# Patient Record
Sex: Male | Born: 1965 | Race: Black or African American | Hispanic: No | Smoking: Current every day smoker
Health system: Southern US, Community
[De-identification: ages and names within clinical notes are randomized; demographics above are authoritative.]

## PROBLEM LIST (undated history)

## (undated) DIAGNOSIS — K219 Gastro-esophageal reflux disease without esophagitis: Secondary | ICD-10-CM

## (undated) DIAGNOSIS — Z8719 Personal history of other diseases of the digestive system: Secondary | ICD-10-CM

## (undated) DIAGNOSIS — I10 Essential (primary) hypertension: Secondary | ICD-10-CM

## (undated) DIAGNOSIS — Z8711 Personal history of peptic ulcer disease: Secondary | ICD-10-CM

## (undated) DIAGNOSIS — M1990 Unspecified osteoarthritis, unspecified site: Secondary | ICD-10-CM

## (undated) DIAGNOSIS — K852 Alcohol induced acute pancreatitis without necrosis or infection: Secondary | ICD-10-CM

## (undated) DIAGNOSIS — E7800 Pure hypercholesterolemia, unspecified: Secondary | ICD-10-CM

## (undated) HISTORY — DX: Unspecified osteoarthritis, unspecified site: M19.90

---
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
## 2001-01-24 ENCOUNTER — Emergency Department (HOSPITAL_COMMUNITY): Admission: EM | Admit: 2001-01-24 | Discharge: 2001-01-24 | Payer: Self-pay | Admitting: Emergency Medicine

## 2001-04-03 ENCOUNTER — Emergency Department (HOSPITAL_COMMUNITY): Admission: EM | Admit: 2001-04-03 | Discharge: 2001-04-03 | Payer: Self-pay | Admitting: Emergency Medicine

## 2002-07-30 ENCOUNTER — Encounter: Payer: Self-pay | Admitting: Emergency Medicine

## 2002-07-30 ENCOUNTER — Emergency Department (HOSPITAL_COMMUNITY): Admission: EM | Admit: 2002-07-30 | Discharge: 2002-07-30 | Payer: Self-pay | Admitting: Emergency Medicine

## 2002-08-28 ENCOUNTER — Emergency Department (HOSPITAL_COMMUNITY): Admission: EM | Admit: 2002-08-28 | Discharge: 2002-08-28 | Payer: Self-pay | Admitting: Emergency Medicine

## 2002-08-28 ENCOUNTER — Encounter: Payer: Self-pay | Admitting: Emergency Medicine

## 2002-09-02 ENCOUNTER — Inpatient Hospital Stay (HOSPITAL_COMMUNITY): Admission: EM | Admit: 2002-09-02 | Discharge: 2002-09-04 | Payer: Self-pay | Admitting: Emergency Medicine

## 2002-09-25 ENCOUNTER — Inpatient Hospital Stay (HOSPITAL_COMMUNITY): Admission: EM | Admit: 2002-09-25 | Discharge: 2002-09-26 | Payer: Self-pay | Admitting: Emergency Medicine

## 2002-09-25 ENCOUNTER — Encounter: Payer: Self-pay | Admitting: Internal Medicine

## 2003-11-07 ENCOUNTER — Emergency Department (HOSPITAL_COMMUNITY): Admission: EM | Admit: 2003-11-07 | Discharge: 2003-11-07 | Payer: Self-pay | Admitting: Emergency Medicine

## 2003-11-08 ENCOUNTER — Emergency Department (HOSPITAL_COMMUNITY): Admission: EM | Admit: 2003-11-08 | Discharge: 2003-11-08 | Payer: Self-pay | Admitting: Emergency Medicine

## 2004-04-25 ENCOUNTER — Emergency Department (HOSPITAL_COMMUNITY): Admission: EM | Admit: 2004-04-25 | Discharge: 2004-04-25 | Payer: Self-pay | Admitting: Emergency Medicine

## 2004-04-26 ENCOUNTER — Emergency Department (HOSPITAL_COMMUNITY): Admission: EM | Admit: 2004-04-26 | Discharge: 2004-04-26 | Payer: Self-pay | Admitting: *Deleted

## 2004-07-01 ENCOUNTER — Emergency Department (HOSPITAL_COMMUNITY): Admission: EM | Admit: 2004-07-01 | Discharge: 2004-07-01 | Payer: Self-pay | Admitting: Emergency Medicine

## 2004-12-03 ENCOUNTER — Emergency Department (HOSPITAL_COMMUNITY): Admission: EM | Admit: 2004-12-03 | Discharge: 2004-12-03 | Payer: Self-pay | Admitting: Emergency Medicine

## 2005-01-26 ENCOUNTER — Emergency Department (HOSPITAL_COMMUNITY): Admission: EM | Admit: 2005-01-26 | Discharge: 2005-01-26 | Payer: Self-pay | Admitting: Emergency Medicine

## 2007-07-02 ENCOUNTER — Emergency Department (HOSPITAL_COMMUNITY): Admission: EM | Admit: 2007-07-02 | Discharge: 2007-07-02 | Payer: Self-pay | Admitting: *Deleted

## 2007-07-05 ENCOUNTER — Emergency Department (HOSPITAL_COMMUNITY): Admission: EM | Admit: 2007-07-05 | Discharge: 2007-07-05 | Payer: Self-pay | Admitting: Emergency Medicine

## 2007-07-07 ENCOUNTER — Emergency Department (HOSPITAL_COMMUNITY): Admission: EM | Admit: 2007-07-07 | Discharge: 2007-07-07 | Payer: Self-pay | Admitting: Emergency Medicine

## 2007-07-09 ENCOUNTER — Emergency Department (HOSPITAL_COMMUNITY): Admission: EM | Admit: 2007-07-09 | Discharge: 2007-07-09 | Payer: Self-pay | Admitting: Emergency Medicine

## 2007-07-11 ENCOUNTER — Emergency Department (HOSPITAL_COMMUNITY): Admission: EM | Admit: 2007-07-11 | Discharge: 2007-07-11 | Payer: Self-pay | Admitting: Emergency Medicine

## 2008-01-02 ENCOUNTER — Emergency Department (HOSPITAL_COMMUNITY): Admission: EM | Admit: 2008-01-02 | Discharge: 2008-01-02 | Payer: Self-pay | Admitting: Emergency Medicine

## 2009-01-18 ENCOUNTER — Emergency Department (HOSPITAL_COMMUNITY): Admission: EM | Admit: 2009-01-18 | Discharge: 2009-01-18 | Payer: Self-pay | Admitting: Emergency Medicine

## 2011-02-03 LAB — DIFFERENTIAL
Basophils Absolute: 0 10*3/uL (ref 0.0–0.1)
Basophils Relative: 0 % (ref 0–1)
Eosinophils Absolute: 0.1 10*3/uL (ref 0.0–0.7)
Eosinophils Relative: 1 % (ref 0–5)
Lymphocytes Relative: 27 % (ref 12–46)

## 2011-02-03 LAB — BASIC METABOLIC PANEL
BUN: 11 mg/dL (ref 6–23)
CO2: 28 mEq/L (ref 19–32)
Calcium: 9.2 mg/dL (ref 8.4–10.5)
GFR calc non Af Amer: >60 mL/min (ref >60)
Glucose, Bld: 93 mg/dL (ref 70–99)
Potassium: 3.4 mEq/L — ABNORMAL LOW (ref 3.5–5.1)

## 2011-02-03 LAB — CBC
HCT: 43.5 % (ref 39.0–52.0)
MCHC: 33.9 g/dL (ref 30.0–36.0)
Platelets: 303 10*3/uL (ref 150–400)
RDW: 14.8 % (ref 11.5–15.5)

## 2011-02-03 LAB — RAPID URINE DRUG SCREEN, HOSP PERFORMED
Barbiturates: NOT DETECTED
Benzodiazepines: NOT DETECTED

## 2011-02-03 LAB — LIPASE, BLOOD: Lipase: 33 U/L (ref 11–59)

## 2011-03-11 NOTE — H&P (Signed)
NAME:  Gary [name], Gary [name] NO.:  [id number]   MEDICAL RECORD NO.:  [id number]                   PATIENT TYPE:  INP   LOCATION:  A228                                 FACILITY:  [facility]   PHYSICIAN:  Gracelyn Nurse, M.D.              DATE OF BIRTH:  [birth year]   DATE OF ADMISSION:  09/25/2002  DATE OF DISCHARGE:                                HISTORY & PHYSICAL   CHIEF COMPLAINT:  Vomiting blood.   HISTORY OF PRESENT ILLNESS:  This is a 45 year old black male who was  discharged about four weeks ago from [facility] after admission for  GI bleeding.  He was found at that time to have an H. pylori positive peptic  ulcer disease exacerbated by taking Goody's Powders for a tooth abscess.  Since then, he has had the tooth extracted and he has finished his treatment  for the H. pylori.  He was doing well, until this a.m., when he began to  have abdominal pain and right upper quadrant pain and vomited bright red  blood.  He denied any NSAID or Goody's Powder use.  He did say he drank some  alcohol over the Thanksgiving holiday.   PAST MEDICAL HISTORY:  1. Duodenal ulcer.     a. Diagnosed by EGD on September 03, 2002.     b. H. pylori positive, now status post treatment.  2. History of tooth abscess.     a. Status post extraction on September 04, 2002.  3. History of pancreatitis three years ago.  4. History of ulcer disease three years ago.     a. Diagnosed by EGD at [facility] per patient.   ALLERGIES:  No known drug allergies.   CURRENT MEDICATIONS:  Phenergan 25 mg q.4h. p.r.n.   SOCIAL HISTORY:  Drinks a 22-ounce beer a day; however, he says he has not  drank any since the Thanksgiving holiday and that was only a small amount.  He smokes about half a pack of cigarettes a day.  He is currently engaged  and currently is unemployed.   FAMILY HISTORY:  His mother died at age 50 of possible MI and had  hypertension.  He does not know anything about  his father.   REVIEW OF SYSTEMS:  As per HPI.  All other systems reviewed and are normal.   PHYSICAL EXAMINATION:  VITAL SIGNS:  Temperature 97.4, pulse 68,  respirations 32, blood pressure 151/101.  GENERAL:  This is a well-nourished black male in no acute distress.  HEENT:  Pupils are equal, round, and reactive to light.  Equal ocular  movement intact.  Oral mucosa is moist.  Oropharynx is clear.  No further  sign of tooth abscess.  There are two well-healed gumlines where two teeth  were extracted on the right side.  CARDIOVASCULAR:  Regular rate and rhythm.  No murmurs.  LUNGS:  Clear to auscultation.  ABDOMEN:  Soft,  nondistended.  Bowel sounds are positive.  There is some  mild right upper quadrant tenderness.  EXTREMITIES:  No edema.  NEUROLOGIC:  Cranial nerves II-XII grossly intact without focal deficit.  RECTAL:  Trace guaiac positive per EDP.   ADMITTING LABORATORY DATA:  White blood cell 9.1, hemoglobin 15.1, platelets  307.  Sodium 137, potassium 3.5, chloride 104, CO2 28, BUN 16, creatinine  1.1, glucose 120, SGOT 34, SGPT 33, alkaline phosphatase 53.  Amylase 289,  lipase 89.   ASSESSMENT AND PLAN:  1. Gastrointestinal bleeding.  This is likely secondary to his peptic ulcer     disease.  He may have failed the Helicobacter pylori treatment.  Also, he     may have aggravated his ulcer disease by drinking alcohol over the     Thanksgiving holiday.  He does deny any nonsteroidal anti-inflammatory     drug use or Goody's Powder use.  I will go ahead and start a proton pump     inhibitor, keep him n.p.o., monitor his hemoglobin.  We will go ahead and     consult gastroenterology for possible repeat of     esophagogastroduodenoscopy.  2. Mild pancreatitis.  Lipase and amylase are elevated.  The amylase may be     elevated secondary to his vomiting.  The lipase is only mildly elevated     but we will continue n.p.o. for now and give him pain medications.                                                Gracelyn Nurse, M.D.    JDJ/MEDQ  D:  09/25/2002  T:  09/25/2002  Job:  [id number]

## 2011-03-11 NOTE — Discharge Summary (Signed)
NAME:  Gary [name], Gary [name] NO.:  [id number]   MEDICAL RECORD NO.:  [id number]                   PATIENT TYPE:  INP   LOCATION:  A228                                 FACILITY:  [facility]   PHYSICIAN:  Gracelyn Nurse, M.D.              DATE OF BIRTH:  [birth year]   DATE OF ADMISSION:  09/25/2002  DATE OF DISCHARGE:  09/26/2002                                 DISCHARGE SUMMARY   DISCHARGE DIAGNOSES:  1. Abdominal pain.  2. Mild pancreatitis.  3. Gastrointestinal bleeding.  4. History of duodenal ulcer.     a. Diagnosed by EGD on September 03, 2002.     b. Helicobacter pylori positive now status post treatment.  5. History of tooth abscess.     a. Status post extraction on September 04, 2002.  6. History of pancreatitis 3 years ago.  7. History of peptic ulcer disease 3 years ago.     a. Diagnosed by EGD at [facility] per the patient.   DISCHARGE MEDICATIONS:  1. Zantac 75 mg two b.i.d.  2. Phenergan 25 mg one q.4h. p.r.n.  3. Darvocet-N 100 one to two q.6h. p.r.n.   PROCEDURES:  1. Abdominal CT which was essentially normal.  2. Abdominal ultrasound showed no sign of cholecystitis.   REASON FOR ADMISSION:  The patient is a 45 year old black male who was  recently discharged 4 weeks ago from [facility] after having H.  pylori positive peptic ulcer diagnosed.  He had been treated with PPI and  antibiotics.  He was doing well until this morning he began to have  abdominal pain and right upper quadrant pain and vomited what he said was  some bright red blood.  He had recently been taking some over-the-counter  cold medications and also some home med cold medications with white liquor  in it.   HOSPITAL COURSE:  Problem 1. ABDOMINAL PAIN.  The patient was started on a  PPI and also on IV morphine, pain resolved, he was wanting to eat the next  day and tolerated that okay.   Problem 2. GASTROINTESTINAL BLEEDING.  He did not have anymore vomiting of  blood in hospital, he was guaiac negative, he was on a proton pump inhibitor  here and his hemoglobin was stable.  Dr. Jena Gauss saw him in  consultation, elected not to do an EGD, at this point just continue him on  acid suppression.  I advised the patient to stay away again from NSAIDs  which can be in over-the-counter cold remedies and also to avoid alcohol.   DISPOSITION:  The patient is discharged in stable condition.                                               Gracelyn Nurse, M.D.  JDJ/MEDQ  D:  09/26/2002  T:  09/27/2002  Job:  [id number]

## 2011-03-11 NOTE — Consult Note (Signed)
NAME:  Gary [name], Gary [name]                       ACCOUNT NO.:  [id number]   MEDICAL RECORD NO.:  [id number]                   PATIENT TYPE:  INP   LOCATION:  A306                                 FACILITY:  [facility]   PHYSICIAN:  R. Roetta Sessions, M.D.              DATE OF BIRTH:  [birth year]   DATE OF CONSULTATION:  09/02/2002  DATE OF DISCHARGE:                                   CONSULTATION   REASON FOR CONSULTATION:  Epigastric pain, elevated amylase, heme positive  stool, NSAID abuse.   HISTORY OF PRESENT ILLNESS:  This patient is a pleasant, 45 year old African-  American male admitted last evening with waxing and waning of abdominal pain  worse over the past 1 week; taking quite a bit of over-the-counter NSAIDs  including aspirin and Goody powders for bad teeth.  He reportedly has 2  dental abscesses.  He does not have insurance to get to the dentist.  He has  had abdominal pain off and on for years.  He had an exacerbation 3 years ago  for which he ultimately underwent an EGD for at [facility].  He was told, at that time, that he had peptic ulcer disease which  was NSAID related.   He has had some vomiting, and states that he has vomited some blood  recently.  He has not had any melena or rectal bleeding. He is Hemoccult  positive. He has remained hemodynamically stable.  Hemoglobin was 15.8,  hematocrit of 46.6, MCV of 97.6.  His amylase has remained elevated at 306  and his Lipase is 41.  AST, ALT 39 and 46 respectively.  He does admit to  drinking a 22-ounce beer daily, but denies any other alcohol consumption.   He does not recall anything about being checked for H. pylori during this  evaluation over at [facility].   PAST MEDICAL HISTORY:  Recurrent teeth/dental abscesses.  Reportedly has a  history of pancreatitis, 3 years ago, history of peptic ulcer disease  diagnosed by EGD at [facility].   CURRENT  MEDICATIONS:  Over-the-counter NSAIDS.   ALLERGIES:  No known drug allergies.   FAMILY HISTORY:  Negative for any GI or liver disease.   SOCIAL HISTORY:  The patient is divorced. He has 6 children.  He works in  [employer], lives in [place].  He smokes a pack of cigarettes a day.  Alcohol alluded to above.   REVIEW OF SYSTEMS:  No fever, chills.  No chest pain, no dyspnea.  No melena  or rectal bleeding.   PHYSICAL EXAMINATION:  GENERAL:  Reveals a pleasant, well-nourished  appearing 45 year old gentleman resting comfortably.  HEENT:  No scleral icterus.  Conjunctivae pink.  Dentition appears poor.  NECK:  JVD is not prominent.  CHEST:  Lungs were clear to auscultation.  CARDIAC:  Regular rate and rhythm without murmur, gallop, or rub.  ABDOMEN:  Nondistended,  positive bowel sounds.  Soft and really nontender to  palpation.  No appreciable mass or organomegaly.  EXTREMITIES:  No edema.   LABORATORY DATA:  White count 10.5, H&H 15.8 and 46.6, MCV 97.6.  Sodium  130, potassium 3.7, chloride 105, CO2 30, glucose 94, BUN 12, creatinine  1.0, calcium 9.4, total protein 6.9, albumin 3.8, AST 39, ALT 46, ALP 51,  total bilirubin 0.3, amylase 306, lipase 41.   IMPRESSION:  This patient is a pleasant, 45 year old gentleman admitted to  the hospital with acute exacerbation of chronic epigastric pain associated  with nausea, vomiting and possible hematemesis.  He is Hemoccult positive on  digital rectal examination.  He is hemodynamically stable.  He readily  admits to using NSAIDS to excess for his dental abscess.  He has a history  of peptic ulcer disease.   In this setting, I do not think that his amylase is indicative of  pancreatitis but nonspecific and may be related to NSAID gastropathy and  peptic ulcer disease.  Again, he is hemodynamically stable.  Not mentioned  above he has been started on IV Protonix 40 mg daily empirically.   He needs to have an  esophagogastroduodenoscopy.   RECOMMENDATIONS:  1. I agree with PPI therapy empirically.  2. We will plan for EGD on 09/03/02.  I discussed this approach with the     patient.  I feel that is low risk for conscious sedation.  ASA I-II.  3. Would allow clear liquid diet this evening and will make him NPO after     midnight.  4. I have discussed the patient with Dr. Letitia Libra earlier today.   I would like to thank Dr. Letitia Libra for allowing me to see this nice  gentleman this evening.                                               Jonathon Bellows, M.D.    RMR/MEDQ  D:  09/02/2002  T:  09/02/2002  Job:  [id number]   cc:   Gracelyn Nurse, M.D.  [address]  [place] [address]  Fax: [phone]

## 2011-03-11 NOTE — Discharge Summary (Signed)
NAME:  Gary, [name] NO.:  [id number]   MEDICAL RECORD NO.:  [id number]                   PATIENT TYPE:  INP   LOCATION:  A306                                 FACILITY:  [facility]   PHYSICIAN:  Gracelyn Nurse, M.D.              DATE OF BIRTH:  15-Sep-1966   DATE OF ADMISSION:  09/02/2002  DATE OF DISCHARGE:  09/04/2002                                 DISCHARGE SUMMARY   DISCHARGE DIAGNOSES:  1. Duodenal ulcer.     a. Helicobacter pylori positive.  2. Tooth abscess.  3. Abdominal pain.  4. History of pancreatitis three years ago.  5. History of ulcers three years ago.     a. Diagnosed by EGD at [facility]   DISCHARGE MEDICATIONS:  1. Prevacid 30 mg one p.o. b.i.d.  2. Clarithromycin 500 mg b.i.d. x2 weeks.  3. Amoxicillin 500 mg two b.i.d. x2 weeks.  4. Darvocet-N 100 one to two q.4h. p.r.n.   PROCEDURE:  Upper endoscopy which showed a duodenal ulcer with exposed  vessel.   REASON FOR ADMISSION:  This is a 45 year old black male.  He has had a one  week history of abdominal pain, nausea and vomiting, and occasional coffee  ground emesis.  He was seen in the ER three days ago, given Phenergan, and  discharged home.  He has been taking Goody's powders about q.i.d., about six  to eight Aleve during the day for the past two weeks because of a tooth  ache.   HOSPITAL COURSE:  1. DUODENAL ULCER:  The patient was admitted, made n.p.o., place on pro time     pump inhibitor.  Dr. Jena Gauss was consulted.  EGD was performed the next day     where he found results above.  The patient's CLO test came back positive     for H. pylori so he will be treated with pro time pump inhibitors,     clarithromycin, and amoxicillin for eradication.  He is also strongly     advised to stay away from NSAIDs, Goody's powders, BC powders, and to     take only Tylenol for his pain.  2. TOOTH ABSCESS:  He was placed on antibiotics for this.  He is currently     unemployed and  cannot afford to go     to the dentist.  Case managers here at the hospital did locate a dentist,     Dr. Merilynn Finland who has agreed to see him and he will see him at 1:30 on     day of discharge.   DISPOSITION:  The patient is discharged in stable condition.  He will follow  up with Dr. Jena Gauss in two weeks.  Gracelyn Nurse, M.D.    JDJ/MEDQ  D:  09/04/2002  T:  09/04/2002  Job:  [id number]   cc:   R. Roetta Sessions, M.D.  [address]  [place]  [place] [address]  Fax: [phone]

## 2011-03-11 NOTE — Op Note (Signed)
NAME:  Gary [name], Gary [name]                       ACCOUNT NO.:  [id number]   MEDICAL RECORD NO.:  [id number]                   PATIENT TYPE:  INP   LOCATION:  A306                                 FACILITY:  [facility]   PHYSICIAN:  R. Roetta Sessions, M.D.              DATE OF BIRTH:  [birth year]   DATE OF PROCEDURE:  09/03/2002  DATE OF DISCHARGE:                                 OPERATIVE REPORT   PROCEDURE:  EGD with thermal sealing of bleeding lesion.   INDICATIONS FOR PROCEDURE:  The patient is a 45 year old gentleman admitted  to the hospital with nausea, vomiting, epigastric pain, is Hemoccult  positive.  He has been taking NSAIDs to excess recently.  He has a history  of peptic ulcer disease thought to be NSAID related.  EGD is now being done  to further evaluate his symptoms.  The procedure has been discussed with [name] previously, yesterday, and again today at the bedside.  Potential  risks, benefits, and alternatives have been reviewed, questions answered.  He is agreeable.  Please see the documentation in the medical record for  more information ASE2.   PROCEDURE NOTE:  O2 saturation, blood pressure, and pulse oximetry were  monitored throughout the entire procedure.  Conscious sedation Versed 6 mg  IV, Demerol 150 mg IV in divided doses.  The instrument Olympus video adult  diagnostic gastroscope, Cetacaine spray for topical pharyngeal anesthesia.   FINDINGS:  ESOPHAGUS:  Examination of the tubular esophagus revealed no  mucosal abnormalities.  The EG junction was easily traversed.   STOMACH:  Gastric cavity was emptied and insufflated well with air.  A  thorough examination of the gastric mucosa including retroflexion view of  the proximal stomach, esophagogastric junction demonstrated a 3 x 4 area of  intense erythema injection of the cardia.  This is most consistent with  trauma from retching/vomiting.  There are a couple of superficial antral  erosions.  The  remainder of the gastric mucosa appeared normal.  The pylorus  was patent and easily reversed.   BULB:  In the bulb, there was a 2 cm, elongated ulcer on the posterior wall  with a visible vessel.  Please see photos.  There was surrounding erythema  and erosions.  D2 appeared abnormal.   THERAPY/DIAGNOSTIC MANEUVERS PERFORMED:  Antral biopsies x 2.  CLOtest was  obtained.  The ulcer crater base was thermally sealed with three  applications of the Gold Probe.  The patient tolerated the procedure well  and was reactive at endoscopy.   IMPRESSION:  1. Normal esophagus.  2. A 3 x 4 cm area of intense erythema injection of the cardia mucosa,     consistent with trauma from tubing.  3. Superficial antral erosions.  The remainder of gastric mucosa appeared     normal.  4. Bulbar ulcer with surrounding inflammation with stigmata of bleeding,     status post thermal sealing  as described above.  Remainder of bulb and     second portion appeared normal.   RECOMMENDATIONS:  1. Clear liquid diet today.  2. Continue Protonix 40 mg IV daily.  3. Follow up H&H, follow up CLOtest.  4. Avoid nonsteroidals.  Further recommendations to follow.                                               Gary [name], M.D.    RMR/MEDQ  D:  09/03/2002  T:  09/03/2002  Job:  [id number]   cc:   Gracelyn Nurse, M.D.  [address]  [place] [address]  Fax: [phone]

## 2011-03-11 NOTE — H&P (Signed)
NAME:  Gary [name], Gary [name] NO.:  [id number]   MEDICAL RECORD NO.:  [id number]                   PATIENT TYPE:  EMS   LOCATION:  ED                                   FACILITY:  [facility]   PHYSICIAN:  Gracelyn Nurse, M.D.              DATE OF BIRTH:  1965-12-24   DATE OF ADMISSION:  09/02/2002  DATE OF DISCHARGE:                                HISTORY & PHYSICAL   CHIEF COMPLAINT:  Abdominal pain.   HISTORY OF PRESENT ILLNESS:  This is a 45 year old black male who presents  with a one-week history of abdominal pain, nausea and vomiting, and  occasional coffee ground emesis.  He was seen in the ER three days ago,  given Phenergan, and discharged home.  He states he has been taking Goody's  Powders about four times a day and also taking about six to eight Aleve a  day for the past two weeks because of a toothache.  He says he cannot afford  to go to a dentist to have it taken care of.   PAST MEDICAL HISTORY:  1. History of pancreatitis three years ago.  2. History of ulcers three years ago:  Diagnosed by EGD at [facility].   ALLERGIES:  No known drug allergies.   SOCIAL HISTORY:  Drinks a 22-ounce beer a day.  Smokes a half a pack of  cigarettes a day.  He is engaged.  He is currently unemployed and looking  for work.   FAMILY HISTORY:  He does not known anything about his father.  His mother  died at age 63 of possible MI.  She had hypertension.   REVIEW OF SYSTEMS:  As per HPI.  All other systems reviewed and are normal.   VITAL SIGNS:  Temperature 97.3, pulse 68, respirations 20, blood pressure  165/95.   PHYSICAL EXAMINATION:  GENERAL:  This is a well-nourished black male in no  acute distress.  HEENT:  Pupils equal, round, and reactive to light.  Extraocular movement  intact.  Oral mucosa is moist.  Oropharynx is clear.  There is a decayed  tooth in the upper right with abscess formation.  There is also another  decayed tooth in the left side that  is tender but there does not appear to  be an abscess formed.  CARDIOVASCULAR:  Regular rate and rhythm, no murmurs.  LUNGS:  Clear to auscultation.  ABDOMEN:  Soft, nondistended, bowel sounds are positive.  There is some mild  epigastric tenderness with no rebound.  EXTREMITIES:  No edema.  NEUROLOGIC:  Cranial nerves II-XII grossly intact.  SKIN:  Moist, no rash.  RECTAL:  Trace heme positive.   ADMISSION LABORATORY DATA:  Amylase 306, lipase 41.  White blood cells 10.5,  hemoglobin 15.8, platelets 288.  Sodium 138, potassium 3.7, chloride 105,  CO2 30, BUN 12, creatinine 1.0, glucose 94, SGOT 39, SGPT 46, alkaline phos  51, total  bilirubin 0.3.   ASSESSMENT AND PLAN:  1. Abdominal pain.  With the history of this pain, the nausea and vomiting,     and the history of taking the B.C. Powders and Aleve, I am highly     suspicious that this is either an ulcer or erosive gastritis.  His     amylase is elevated but not his lipase, so I suspect this is from the     vomiting and not from pancreatitis.  We will go ahead and make him     n.p.o., keep him hydrated with IV fluids, start a PPI, and given him pain     medicines and antiemetics, and I will go ahead and consult GI for     possible EGD.  2. Tooth abscess.  Will go ahead and start him on Rocephin.  He will need to     have a dental referral.  As stated above, he said he cannot afford to go     to the dentist and have this taken care of.  I will ask the case manager     to see if we can find a dentist willing to treat him.  3. Elevated blood pressure.  He has no history of hypertension.  This may be     secondary to the pain.  We will just monitor it during the hospital stay     and treat as needed.                                               Gracelyn Nurse, M.D.    JDJ/MEDQ  D:  09/02/2002  T:  09/02/2002  Job:  [id number]

## 2011-08-04 LAB — DIFFERENTIAL
Band Neutrophils: 0
Basophils Absolute: 0
Basophils Relative: 0
Eosinophils Relative: 4
Lymphocytes Relative: 28
Lymphs Abs: 1.8
Metamyelocytes Relative: 0
Monocytes Relative: 7
Myelocytes: 0
Neutrophils Relative %: 64
nRBC: 0

## 2011-08-04 LAB — COMPREHENSIVE METABOLIC PANEL
ALT: 19
AST: 23
Albumin: 4
BUN: 9
CO2: 31
CO2: 31
Calcium: 9.1
Calcium: 9.3
Chloride: 103
Creatinine, Ser: 1.13
Creatinine, Ser: 1.27
GFR calc Af Amer: >60
GFR calc non Af Amer: >60
Glucose, Bld: 119 — ABNORMAL HIGH
Sodium: 142
Total Bilirubin: 0.7

## 2011-08-04 LAB — CBC
HCT: 44.7
Hemoglobin: 14.9
MCHC: 33.3
MCHC: 34.1
MCV: 96.6
MCV: 96.6
Platelets: 330
RBC: 4.55
RBC: 4.63
WBC: 6.4
WBC: 7.7

## 2011-08-04 LAB — URINALYSIS, ROUTINE W REFLEX MICROSCOPIC
Glucose, UA: NEGATIVE
Ketones, ur: NEGATIVE
Nitrite: NEGATIVE
Protein, ur: NEGATIVE
pH: 6.5

## 2011-08-04 LAB — ETHANOL: Alcohol, Ethyl (B): <5

## 2011-08-05 LAB — BASIC METABOLIC PANEL
BUN: 18
CO2: 26
Chloride: 106
GFR calc non Af Amer: >60
Glucose, Bld: 105 — ABNORMAL HIGH
Potassium: 3.6
Sodium: 140

## 2011-08-05 LAB — RAPID URINE DRUG SCREEN, HOSP PERFORMED
Amphetamines: NOT DETECTED
Cocaine: POSITIVE — AB
Opiates: NOT DETECTED
Tetrahydrocannabinol: NOT DETECTED

## 2011-08-05 LAB — CBC
HCT: 43.2
Hemoglobin: 14.5
MCV: 95.5
RDW: 14.4 — ABNORMAL HIGH

## 2011-08-05 LAB — DIFFERENTIAL
Basophils Absolute: 0
Eosinophils Absolute: 0.2
Eosinophils Relative: 3
Lymphocytes Relative: 36
Monocytes Absolute: 0.9 — ABNORMAL HIGH

## 2020-01-11 ENCOUNTER — Other Ambulatory Visit: Payer: Self-pay

## 2020-01-11 ENCOUNTER — Observation Stay (HOSPITAL_COMMUNITY)
Admission: EM | Admit: 2020-01-11 | Discharge: 2020-01-12 | Disposition: A | Discharge status: Home / Self Care | Payer: Self-pay | Attending: Internal Medicine | Admitting: Internal Medicine

## 2020-01-11 ENCOUNTER — Encounter (HOSPITAL_COMMUNITY): Payer: Self-pay

## 2020-01-11 ENCOUNTER — Emergency Department (HOSPITAL_COMMUNITY): Payer: Self-pay

## 2020-01-11 DIAGNOSIS — R1013 Epigastric pain: Secondary | ICD-10-CM

## 2020-01-11 DIAGNOSIS — E876 Hypokalemia: Secondary | ICD-10-CM

## 2020-01-11 DIAGNOSIS — N2889 Other specified disorders of kidney and ureter: Secondary | ICD-10-CM | POA: Insufficient documentation

## 2020-01-11 DIAGNOSIS — R112 Nausea with vomiting, unspecified: Secondary | ICD-10-CM

## 2020-01-11 DIAGNOSIS — N289 Disorder of kidney and ureter, unspecified: Secondary | ICD-10-CM

## 2020-01-11 DIAGNOSIS — K769 Liver disease, unspecified: Secondary | ICD-10-CM

## 2020-01-11 DIAGNOSIS — F1190 Opioid use, unspecified, uncomplicated: Secondary | ICD-10-CM | POA: Insufficient documentation

## 2020-01-11 DIAGNOSIS — F1910 Other psychoactive substance abuse, uncomplicated: Secondary | ICD-10-CM

## 2020-01-11 DIAGNOSIS — I10 Essential (primary) hypertension: Secondary | ICD-10-CM

## 2020-01-11 DIAGNOSIS — F17210 Nicotine dependence, cigarettes, uncomplicated: Secondary | ICD-10-CM | POA: Insufficient documentation

## 2020-01-11 DIAGNOSIS — R1110 Vomiting, unspecified: Secondary | ICD-10-CM | POA: Diagnosis present

## 2020-01-11 DIAGNOSIS — K2900 Acute gastritis without bleeding: Principal | ICD-10-CM

## 2020-01-11 DIAGNOSIS — F1410 Cocaine abuse, uncomplicated: Secondary | ICD-10-CM

## 2020-01-11 DIAGNOSIS — Z20822 Contact with and (suspected) exposure to covid-19: Secondary | ICD-10-CM | POA: Insufficient documentation

## 2020-01-11 DIAGNOSIS — E872 Acidosis, unspecified: Secondary | ICD-10-CM

## 2020-01-11 DIAGNOSIS — K7689 Other specified diseases of liver: Secondary | ICD-10-CM | POA: Insufficient documentation

## 2020-01-11 DIAGNOSIS — Z79899 Other long term (current) drug therapy: Secondary | ICD-10-CM | POA: Insufficient documentation

## 2020-01-11 DIAGNOSIS — F1290 Cannabis use, unspecified, uncomplicated: Secondary | ICD-10-CM | POA: Insufficient documentation

## 2020-01-11 DIAGNOSIS — K219 Gastro-esophageal reflux disease without esophagitis: Secondary | ICD-10-CM

## 2020-01-11 DIAGNOSIS — F1490 Cocaine use, unspecified, uncomplicated: Secondary | ICD-10-CM | POA: Insufficient documentation

## 2020-01-11 HISTORY — DX: Pure hypercholesterolemia, unspecified: E78.00

## 2020-01-11 HISTORY — DX: Personal history of peptic ulcer disease: Z87.11

## 2020-01-11 HISTORY — DX: Alcohol induced acute pancreatitis without necrosis or infection: K85.20

## 2020-01-11 HISTORY — DX: Essential (primary) hypertension: I10

## 2020-01-11 HISTORY — DX: Gastro-esophageal reflux disease without esophagitis: K21.9

## 2020-01-11 LAB — RAPID URINE DRUG SCREEN, HOSP PERFORMED
Amphetamines: NOT DETECTED
Barbiturates: NOT DETECTED
Benzodiazepines: NOT DETECTED
Cocaine: POSITIVE — AB
Opiates: POSITIVE — AB
Tetrahydrocannabinol: POSITIVE — AB

## 2020-01-11 LAB — CBC WITH DIFFERENTIAL/PLATELET
Abs Immature Granulocytes: 0.02 10*3/uL (ref 0.00–0.07)
Basophils Absolute: 0 10*3/uL (ref 0.0–0.1)
Basophils Relative: 1 %
Eosinophils Absolute: 0.2 10*3/uL (ref 0.0–0.5)
Eosinophils Relative: 3 %
HCT: 46 % (ref 39.0–52.0)
Hemoglobin: 14.9 g/dL (ref 13.0–17.0)
Immature Granulocytes: 0 %
Lymphocytes Relative: 51 %
Lymphs Abs: 3.6 10*3/uL (ref 0.7–4.0)
MCH: 32 pg (ref 26.0–34.0)
MCHC: 32.4 g/dL (ref 30.0–36.0)
MCV: 98.7 fL (ref 80.0–100.0)
Monocytes Absolute: 1.1 10*3/uL — ABNORMAL HIGH (ref 0.1–1.0)
Monocytes Relative: 16 %
Neutro Abs: 2 10*3/uL (ref 1.7–7.7)
Neutrophils Relative %: 29 %
Platelets: 357 10*3/uL (ref 150–400)
RBC: 4.66 MIL/uL (ref 4.22–5.81)
RDW: 13.6 % (ref 11.5–15.5)
WBC: 6.9 10*3/uL (ref 4.0–10.5)
nRBC: 0 % (ref 0.0–0.2)

## 2020-01-11 LAB — COMPREHENSIVE METABOLIC PANEL
ALT: 43 U/L (ref 0–44)
AST: 32 U/L (ref 15–41)
Albumin: 4.1 g/dL (ref 3.5–5.0)
Alkaline Phosphatase: 53 U/L (ref 38–126)
Anion gap: 9 (ref 5–15)
BUN: 14 mg/dL (ref 6–20)
CO2: 28 mmol/L (ref 22–32)
Calcium: 9 mg/dL (ref 8.9–10.3)
Chloride: 103 mmol/L (ref 98–111)
Creatinine, Ser: 1.09 mg/dL (ref 0.61–1.24)
GFR calc Af Amer: >60 mL/min (ref >60)
GFR calc non Af Amer: >60 mL/min (ref >60)
Glucose, Bld: 127 mg/dL — ABNORMAL HIGH (ref 70–99)
Potassium: 3.2 mmol/L — ABNORMAL LOW (ref 3.5–5.1)
Sodium: 140 mmol/L (ref 135–145)
Total Bilirubin: 0.3 mg/dL (ref 0.3–1.2)
Total Protein: 6.9 g/dL (ref 6.5–8.1)

## 2020-01-11 LAB — URINALYSIS, ROUTINE W REFLEX MICROSCOPIC
Bilirubin Urine: NEGATIVE
Glucose, UA: 150 mg/dL — AB
Hgb urine dipstick: NEGATIVE
Ketones, ur: NEGATIVE mg/dL
Leukocytes,Ua: NEGATIVE
Nitrite: NEGATIVE
Protein, ur: NEGATIVE mg/dL
Specific Gravity, Urine: 1.016 (ref 1.005–1.030)
pH: 8 (ref 5.0–8.0)

## 2020-01-11 LAB — VITAMIN B12: Vitamin B-12: 488 pg/mL (ref 180–914)

## 2020-01-11 LAB — TSH: TSH: 1.495 u[IU]/mL (ref 0.350–4.500)

## 2020-01-11 LAB — LIPASE, BLOOD: Lipase: 90 U/L — ABNORMAL HIGH (ref 11–51)

## 2020-01-11 LAB — MAGNESIUM: Magnesium: 1.9 mg/dL (ref 1.7–2.4)

## 2020-01-11 LAB — LACTIC ACID, PLASMA
Lactic Acid, Venous: 3 mmol/L (ref 0.5–1.9)
Lactic Acid, Venous: 3.1 mmol/L (ref 0.5–1.9)
Lactic Acid, Venous: 3.2 mmol/L (ref 0.5–1.9)
Lactic Acid, Venous: 3.4 mmol/L (ref 0.5–1.9)

## 2020-01-11 LAB — CBG MONITORING, ED: Glucose-Capillary: 114 mg/dL — ABNORMAL HIGH (ref 70–99)

## 2020-01-11 LAB — TROPONIN I (HIGH SENSITIVITY)
Troponin I (High Sensitivity): 5 ng/L (ref <18)
Troponin I (High Sensitivity): 5 ng/L (ref <18)

## 2020-01-11 LAB — ETHANOL: Alcohol, Ethyl (B): <10 mg/dL (ref <10)

## 2020-01-11 MED ORDER — HYDROMORPHONE HCL 1 MG/ML IJ SOLN
1.0000 mg | Freq: Once | INTRAMUSCULAR | Status: AC
  Administered 2020-01-11: 1 mg via INTRAVENOUS
  Filled 2020-01-11: qty 1

## 2020-01-11 MED ORDER — ASPIRIN 81 MG PO CHEW
324.0000 mg | CHEWABLE_TABLET | Freq: Once | ORAL | Status: AC
  Administered 2020-01-11: 324 mg via ORAL
  Filled 2020-01-11: qty 4

## 2020-01-11 MED ORDER — PROCHLORPERAZINE EDISYLATE 10 MG/2ML IJ SOLN: 10.0000 mg | Freq: Four times a day (QID) | INTRAMUSCULAR | Status: DC | PRN

## 2020-01-11 MED ORDER — SODIUM CHLORIDE 0.9 % IV BOLUS
1000.0000 mL | Freq: Once | INTRAVENOUS | Status: AC
  Administered 2020-01-11: 1000 mL via INTRAVENOUS

## 2020-01-11 MED ORDER — POTASSIUM CHLORIDE IN NACL 20-0.9 MEQ/L-% IV SOLN: INTRAVENOUS | Status: DC

## 2020-01-11 MED ORDER — SUCRALFATE 1 G PO TABS: 1.0000 g | ORAL_TABLET | Freq: Three times a day (TID) | ORAL | 0 refills | Status: DC

## 2020-01-11 MED ORDER — SODIUM CHLORIDE 0.9 % IV SOLN: INTRAVENOUS | Status: DC

## 2020-01-11 MED ORDER — PANTOPRAZOLE SODIUM 40 MG IV SOLR
40.0000 mg | Freq: Once | INTRAVENOUS | Status: AC
  Administered 2020-01-11: 40 mg via INTRAVENOUS
  Filled 2020-01-11: qty 40

## 2020-01-11 MED ORDER — ONDANSETRON HCL 4 MG/2ML IJ SOLN
4.0000 mg | Freq: Once | INTRAMUSCULAR | Status: AC
  Administered 2020-01-11: 4 mg via INTRAVENOUS
  Filled 2020-01-11: qty 2

## 2020-01-11 MED ORDER — ALUM & MAG HYDROXIDE-SIMETH 200-200-20 MG/5ML PO SUSP
30.0000 mL | Freq: Once | ORAL | Status: DC
  Filled 2020-01-11: qty 30

## 2020-01-11 MED ORDER — PROMETHAZINE HCL 25 MG/ML IJ SOLN
25.0000 mg | Freq: Once | INTRAMUSCULAR | Status: AC
  Administered 2020-01-11: 25 mg via INTRAVENOUS
  Filled 2020-01-11: qty 1

## 2020-01-11 MED ORDER — PROMETHAZINE HCL 25 MG/ML IJ SOLN: 25.0000 mg | Freq: Once | INTRAMUSCULAR | Status: DC

## 2020-01-11 MED ORDER — CLONIDINE HCL 0.1 MG PO TABS
0.1000 mg | ORAL_TABLET | Freq: Two times a day (BID) | ORAL | Status: DC
  Administered 2020-01-11 – 2020-01-12 (×3): 0.1 mg via ORAL
  Filled 2020-01-11 (×3): qty 1

## 2020-01-11 MED ORDER — LIDOCAINE VISCOUS HCL 2 % MT SOLN
15.0000 mL | Freq: Once | OROMUCOSAL | Status: AC
  Administered 2020-01-11: 07:00:00 15 mL via ORAL
  Filled 2020-01-11: qty 15

## 2020-01-11 MED ORDER — PROMETHAZINE HCL 25 MG/ML IJ SOLN
12.5000 mg | Freq: Once | INTRAMUSCULAR | Status: AC
  Administered 2020-01-11: 12.5 mg via INTRAVENOUS
  Filled 2020-01-11: qty 1

## 2020-01-11 MED ORDER — HYDRALAZINE HCL 20 MG/ML IJ SOLN: 10.0000 mg | Freq: Three times a day (TID) | INTRAMUSCULAR | Status: DC | PRN

## 2020-01-11 MED ORDER — MORPHINE SULFATE (PF) 4 MG/ML IV SOLN
4.0000 mg | Freq: Once | INTRAVENOUS | Status: AC
  Administered 2020-01-11: 4 mg via INTRAVENOUS
  Filled 2020-01-11: qty 1

## 2020-01-11 MED ORDER — OMEPRAZOLE 20 MG PO CPDR: 20.0000 mg | DELAYED_RELEASE_CAPSULE | Freq: Every day | ORAL | 0 refills | Status: DC

## 2020-01-11 MED ORDER — NICOTINE 21 MG/24HR TD PT24
21.0000 mg | MEDICATED_PATCH | Freq: Every day | TRANSDERMAL | Status: DC
  Administered 2020-01-11 – 2020-01-12 (×2): 21 mg via TRANSDERMAL
  Filled 2020-01-11 (×2): qty 1

## 2020-01-11 MED ORDER — IOHEXOL 350 MG/ML SOLN
100.0000 mL | Freq: Once | INTRAVENOUS | Status: AC | PRN
  Administered 2020-01-11: 100 mL via INTRAVENOUS

## 2020-01-11 MED ORDER — SUCRALFATE 1 GM/10ML PO SUSP
1.0000 g | Freq: Three times a day (TID) | ORAL | Status: DC
  Administered 2020-01-11 – 2020-01-12 (×3): 1 g via ORAL
  Filled 2020-01-11 (×3): qty 10

## 2020-01-11 MED ORDER — PANTOPRAZOLE SODIUM 40 MG IV SOLR
40.0000 mg | INTRAVENOUS | Status: DC
  Administered 2020-01-11: 40 mg via INTRAVENOUS
  Filled 2020-01-11: qty 40

## 2020-01-11 NOTE — ED Provider Notes (Signed)
[facility] EMERGENCY DEPARTMENT Provider Note   CSN: [id number] Arrival date & time: 01/11/20  7371     History Chief Complaint  Patient presents with  . Abdominal Pain    Gary [name] is a 54 y.o. male.  Level 5 caveat for acuity of condition.  Patient moaning and uncooperative.  EMS brought in patient with epigastric pain with nausea and vomiting that woke him from sleep.  He describes this as "indigestion".  He is dry heaving on arrival and very diaphoretic.  He denies chest pain but states he is having epigastric pain that does not radiate.  He has had this pain in the past with his indigestion.  Has not had Prilosec for several weeks.  He denies any alcohol or drug use.  He denies any cardiac history.  Reports history of hypertension and acid reflux disease and possible ulcer. Denies any black or bloody stools.  Denies any cough, runny nose, sore throat, shortness of breath. Patient very diaphoretic and yelling and uncooperative.  The history is provided by the patient and the EMS personnel. The history is limited by the condition of the patient.  Abdominal Pain      No past medical history on file.  There are no problems to display for this patient.   History reviewed. No pertinent surgical history.     No family history on file.  Social History   Tobacco Use  . Smoking status: Not on file  Substance Use Topics  . Alcohol use: Not on file  . Drug use: Not on file    Home Medications Prior to Admission medications   Not on File    Allergies    Patient has no known allergies.  Review of Systems   Review of Systems  Unable to perform ROS: Acuity of condition  Gastrointestinal: Positive for abdominal pain.    Physical Exam Updated Vital Signs BP (!) 177/107 Comment: Simultaneous filing. User may not have seen previous data.  Pulse (!) 49   Resp 19   Ht 5\' 10"  (1.778 m)   Wt 95.3 kg   SpO2 97%   BMI 30.13 kg/m   Physical Exam Vitals and  nursing note reviewed.  Constitutional:      General: He is in acute distress.     Appearance: He is well-developed. He is diaphoretic.     Comments: Moaning, diaphoretic, and uncooperative  HENT:     Head: Normocephalic and atraumatic.     Mouth/Throat:     Pharynx: No oropharyngeal exudate.  Eyes:     Conjunctiva/sclera: Conjunctivae normal.     Pupils: Pupils are equal, round, and reactive to light.  Neck:     Comments: No meningismus. Cardiovascular:     Rate and Rhythm: Normal rate and regular rhythm.     Heart sounds: Normal heart sounds. No murmur.  Pulmonary:     Effort: Pulmonary effort is normal. No respiratory distress.     Breath sounds: Normal breath sounds.  Chest:     Chest wall: No tenderness.  Abdominal:     Palpations: Abdomen is soft.     Tenderness: There is abdominal tenderness. There is no guarding or rebound.     Comments: Epigastric tenderness, voluntary guarding  Musculoskeletal:        General: No tenderness. Normal range of motion.     Cervical back: Normal range of motion and neck supple.     Comments: Equal radial, DP and PT pulses bilaterally  Skin:  General: Skin is warm.     Capillary Refill: Capillary refill takes less than 2 seconds.  Neurological:     General: No focal deficit present.     Mental Status: He is alert and oriented to person, place, and time.     Motor: No abnormal muscle tone.     Comments: Not overly cooperative for formal exam. Moves all extremities equally with appropriate strength.  Psychiatric:        Behavior: Behavior normal.     ED Results / Procedures / Treatments   Labs (all labs ordered are listed, but only abnormal results are displayed) Labs Reviewed  CBC WITH DIFFERENTIAL/PLATELET - Abnormal; Notable for the following components:      Result Value   Monocytes Absolute 1.1 (*)    All other components within normal limits  COMPREHENSIVE METABOLIC PANEL - Abnormal; Notable for the following components:     Potassium 3.2 (*)    Glucose, Bld 127 (*)    All other components within normal limits  LIPASE, BLOOD - Abnormal; Notable for the following components:   Lipase 90 (*)    All other components within normal limits  LACTIC ACID, PLASMA - Abnormal; Notable for the following components:   Lactic Acid, Venous 3.0 (*)    All other components within normal limits  CBG MONITORING, ED - Abnormal; Notable for the following components:   Glucose-Capillary 114 (*)    All other components within normal limits  ETHANOL  LACTIC ACID, PLASMA  RAPID URINE DRUG SCREEN, HOSP PERFORMED  URINALYSIS, ROUTINE W REFLEX MICROSCOPIC  TROPONIN I (HIGH SENSITIVITY)  TROPONIN I (HIGH SENSITIVITY)    EKG EKG Interpretation  Date/Time:  Saturday January 11 2020 06:09:30 EDT Ventricular Rate:  55 PR Interval:    QRS Duration: 79 QT Interval:  416 QTC Calculation: 398 R Axis:   90 Text Interpretation: Sinus rhythm Borderline right axis deviation Left ventricular hypertrophy ST elev, probable normal early repol pattern No significant change was found from EKG four minutes earlier. Confirmed by Ezequiel Essex [phone]) on 01/11/2020 6:32:41 AM   Radiology DG Chest Portable 1 View  Result Date: 01/11/2020 CLINICAL DATA:  Chest pain. EXAM: PORTABLE CHEST 1 VIEW COMPARISON:  No pertinent prior studies available for comparison. FINDINGS: Heart size within normal limits. No evidence of airspace consolidation within the lungs. No evidence of pleural effusion or pneumothorax. No acute bony abnormality. Overlying cardiac monitoring leads. IMPRESSION: No evidence of acute cardiopulmonary abnormality. Electronically Signed   By: Kellie Simmering DO   On: 01/11/2020 07:05   DG Abd Portable 2 Views  Result Date: 01/11/2020 CLINICAL DATA:  Abdominal pain. Additional history provided: Patient reports abdominal pain with vomiting that began this morning and woke patient from sleep, patient reports nausea and indigestion. EXAM:  PORTABLE ABDOMEN - 2 VIEW COMPARISON:  Abdominal ultrasound 07/11/2007, report from CT abdomen 09/25/2002 (images unavailable) FINDINGS: No dilated loops of small bowel are demonstrated to suggest small bowel obstruction. Moderate stool burden within the colon. Evaluation for free air is limited on supine radiography. No acute bony abnormality. Punctate metallic hyperdensity projects over the left femoral head which may be within or external to the patient. IMPRESSION: No radiographic evidence of obstruction. Electronically Signed   By: Kellie Simmering DO   On: 01/11/2020 07:04    Procedures Procedures (including critical care time)  Medications Ordered in ED Medications  alum & mag hydroxide-simeth (MAALOX/MYLANTA) 200-200-20 MG/5ML suspension 30 mL (has no administration in time range)  And  lidocaine (XYLOCAINE) 2 % viscous mouth solution 15 mL (has no administration in time range)  aspirin chewable tablet 324 mg (has no administration in time range)  pantoprazole (PROTONIX) injection 40 mg (has no administration in time range)    ED Course  I have reviewed the triage vital signs and the nursing notes.  Pertinent labs & imaging results that were available during my care of the patient were reviewed by me and considered in my medical decision making (see chart for details).    MDM Rules/Calculators/A&P                     Acute onset of epigastric pain with nausea and vomiting and diaphoresis.  EKG shows ST elevation in anteriorly likely due to LVH and early repolarization.  No comparison EKG.  No reciprocal change in EKG  Reviewed with on-call interventional cardiologist Dr. Katrinka Blazing who agrees likely early repolarization rather than STEMI.   Patient given IV Protonix and GI cocktail which he promptly vomited.  Patient given IV fluids, IV Zofran and morphine.  Chest x-ray is negative for free air. Labs show mildly elevated lipase at 90.  LFTs are normal, white count cell count is normal.   Troponin negative. Lactate is 3.  Suspect this is due to nausea and vomiting and dehydration rather than sepsis.  Patient resting comfortably but is still diaphoretic and bradycardic in the 40s and 50s.  EKG is unchanged.  CT imaging will be obtained to rule out vascular abnormality as well as possible perforated ulcer. Will need second troponin.  Care to be transferred at shift change with CT scan pending to Dr. Deretha Emory. Plan for PPI and carafate at home if imaging and second troponin and lactate reassuring. Avoid alcohol, NSAIDs, caffeine, spicy foods. Final Clinical Impression(s) / ED Diagnoses Final diagnoses:  Epigastric pain    Rx / DC Orders ED Discharge Orders    None       Shakerria Parran, Jeannett Senior, MD 01/11/20 [phone]

## 2020-01-11 NOTE — ED Notes (Signed)
Date and time results received: 01/11/20 0710 (use smartphrase ".now" to insert current time)  Test: LA Critical Value: 3  Name of Provider Notified: Rancour  Orders Received? Or Actions Taken?: fluid boluses

## 2020-01-11 NOTE — ED Provider Notes (Signed)
Patient with continued vomiting.  No blood in the vomit.  Also with a persistent lactic acidosis.  Urine drug screen positive for opiates cocaine and THC.  Patient blood pressure remains elevated.  Discussed with the hospitalist who will admit for gastritis persistent lactic acidosis and hypertension.  Patient's CT scans with incidental findings of perhaps liver mass as well as a right kidney mass.  Do not think this is playing a significant role in the patient's pain.  In addition patient's repeat troponin had no significant change.   Vanetta Mulders, MD 01/11/20 1153

## 2020-01-11 NOTE — H&P (Signed)
History and Physical    Gary [name] YQM:[id number] DOB: 08/20/1966 DOA: 01/11/2020  Referring MD/NP/PA: Dr. Deretha Emory  PCP: Patient, No Pcp Per  Patient coming from: Home  Chief Complaint: nausea, vomiting, mid-epigastric pain.  HPI: Gary [name] is a 53 y.o. 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  Patient reports her symptoms started sudden and woke him up from sleep.  He expressed drinking some beers and using some recreational drugs the day prior to symptom development.  Reports no shortness of breath, no fever, no focal weakness, no dysuria, no hematuria, no weight loss, no blood in his stools, no melena or hematemesis.  Reports only gastric content in his vomitus.   In the ED patient expressed pain to be the worst that he has ever experienced 54 year old 10 in intensity, localized in his mid epigastric area without radiation with associated symptoms of nausea/vomiting (he ended up vomiting 4 times while in the emergency department) and couple times prior to come to the ED.  Pain got better after IV morphine and Viscous Lidocaine.  Imaging studies demonstrated liver mass and kidney mass (unable to characterized) ; no other acute abnormalities reported.  Negative troponin x2 and EKG which was reviewed by on-call cardiologist along with EDP demonstrating early repolarization and no frank ischemic changes.  The rest of patient's blood work was unremarkable except for lactic acid of 3.2, mild hypokalemia and mild elevation of his lipase.  Patient received IV fluids, pain medication, PPI and antibiotics.  TR[facility] contacted to me patient for further evaluation and management.  Past Medical/Surgical History: Past Medical History:  Diagnosis Date  . Acid reflux   . High cholesterol     . History of stomach ulcers   . Hypertension   . Pancreatitis, alcoholic, acute     History reviewed. No pertinent surgical history.  Social History:  reports that he has been smoking. He has never used smokeless tobacco. He reports previous alcohol use. He reports previous drug use.  Allergies: Not on File  Family History:  HTN, otherwise negative for any medical problems that patient is aware of.  Prior to Admission medications   Medication Sig Start Date End Date Taking? Authorizing Provider  omeprazole (PRILOSEC) 20 MG capsule Take 1 capsule (20 mg total) by mouth daily. 01/11/20   Rancour, Jeannett Senior, MD  sucralfate (CARAFATE) 1 g tablet Take 1 tablet (1 g total) by mouth 4 (four) times daily -  with meals and at bedtime. 01/11/20   Rancour, Jeannett Senior, MD    Review of Systems:  Negative except as otherwise mentioned in HPI.   Physical Exam: Vitals:   01/11/20 0730 01/11/20 0900 01/11/20 1314 01/11/20 1528  BP: (!) 177/107 (!) 178/101 137/78 129/68  Pulse:   65 70  Resp: 19 (!) 27 16 20   Temp:   (!) 97.5 F (36.4 C) 98.2 F (36.8 C)  TempSrc:    Oral  SpO2:   100% 100%  Weight:    92.5 kg  Height:    5\' 10"  (1.778 m)    Constitutional: NAD, calm, improved abdominal discomfort after morphine and lidocaine; patient is asking to have diet advanced. Eyes: PERRL, lids and conjunctivae normal, no icterus, no nystagmus. ENMT: Mucous membranes are slightly dry. Posterior pharynx clear of any exudate or lesions. Normal dentition.  Neck: normal, supple,  no masses, no thyromegaly, no JVD. Respiratory: clear to auscultation bilaterally, no wheezing, no crackles. Normal respiratory effort. No accessory muscle use.  Cardiovascular: Regular rate and rhythm, no murmurs / rubs / gallops. No extremity edema. 2+ pedal pulses. No carotid bruits.  Abdomen: no masses palpated. No hepatosplenomegaly. Bowel sounds positive.  Patient with vague mid epigastric area discomfort on  palpation. Musculoskeletal: no clubbing / cyanosis. No joint deformity upper and lower extremities. Good ROM, no contractures. Normal muscle tone.  Skin: no rashes, lesions, ulcers. No induration Neurologic: CN 2-12 grossly intact. Sensation intact, DTR normal. Strength 5/5 in all 4.  Psychiatric: Normal judgment and insight. Alert and oriented x 3. Normal mood.    Labs on Admission: I have personally reviewed the following labs and imaging studies  CBC: Recent Labs  Lab 01/11/20 0610  WBC 6.9  NEUTROABS 2.0  HGB 14.9  HCT 46.0  MCV 98.7  PLT 357   Basic Metabolic Panel: Recent Labs  Lab 01/11/20 0610 01/11/20 1212  NA 140  --   K 3.2*  --   CL 103  --   CO2 28  --   GLUCOSE 127*  --   BUN 14  --   CREATININE 1.09  --   CALCIUM 9.0  --   MG  --  1.9   GFR: Estimated Creatinine Clearance: 88.5 mL/min (by C-G formula based on SCr of 1.09 mg/dL).   Liver Function Tests: Recent Labs  Lab 01/11/20 0610  AST 32  ALT 43  ALKPHOS 53  BILITOT 0.3  PROT 6.9  ALBUMIN 4.1   Recent Labs  Lab 01/11/20 0610  LIPASE 90*   CBG: Recent Labs  Lab 01/11/20 0620  GLUCAP 114*   Thyroid Function Tests: Recent Labs    01/11/20 1212  TSH 1.495   Anemia Panel: Recent Labs    01/11/20 1212  VITAMINB12 488   Urine analysis:    Component Value Date/Time   COLORURINE STRAW (A) 01/11/2020 0811   APPEARANCEUR CLEAR 01/11/2020 0811   LABSPEC 1.016 01/11/2020 0811   PHURINE 8.0 01/11/2020 0811   GLUCOSEU 150 (A) 01/11/2020 0811   HGBUR NEGATIVE 01/11/2020 0811   BILIRUBINUR NEGATIVE 01/11/2020 0811   KETONESUR NEGATIVE 01/11/2020 0811   PROTEINUR NEGATIVE 01/11/2020 0811   UROBILINOGEN 0.2 07/11/2007 0728   NITRITE NEGATIVE 01/11/2020 0811   LEUKOCYTESUR NEGATIVE 01/11/2020 0811   Radiological Exams on Admission: DG Chest Portable 1 View  Result Date: 01/11/2020 CLINICAL DATA:  Chest pain. EXAM: PORTABLE CHEST 1 VIEW COMPARISON:  No pertinent prior studies  available for comparison. FINDINGS: Heart size within normal limits. No evidence of airspace consolidation within the lungs. No evidence of pleural effusion or pneumothorax. No acute bony abnormality. Overlying cardiac monitoring leads. IMPRESSION: No evidence of acute cardiopulmonary abnormality. Electronically Signed   By: Jackey Loge DO   On: 01/11/2020 07:05   DG Abd Portable 2 Views  Result Date: 01/11/2020 CLINICAL DATA:  Abdominal pain. Additional history provided: Patient reports abdominal pain with vomiting that began this morning and woke patient from sleep, patient reports nausea and indigestion. EXAM: PORTABLE ABDOMEN - 2 VIEW COMPARISON:  Abdominal ultrasound 07/11/2007, report from CT abdomen 09/25/2002 (images unavailable) FINDINGS: No dilated loops of small bowel are demonstrated to suggest small bowel obstruction. Moderate stool burden within the colon. Evaluation for free air is limited on supine radiography. No acute bony abnormality. Punctate metallic hyperdensity projects over the left femoral head which may be within or external to  the patient. IMPRESSION: No radiographic evidence of obstruction. Electronically Signed   By: Jackey Loge DO   On: 01/11/2020 07:04   CT Angio Chest/Abd/Pel for Dissection W and/or Wo Contrast  Result Date: 01/11/2020 CLINICAL DATA:  Acute chest and abdominal pain. EXAM: CT ANGIOGRAPHY CHEST, ABDOMEN AND PELVIS TECHNIQUE: Multidetector CT imaging through the chest, abdomen and pelvis was performed using the standard protocol during bolus administration of intravenous contrast. Multiplanar reconstructed images and MIPs were obtained and reviewed to evaluate the vascular anatomy. CONTRAST:  OMNIPAQUE IOHEXOL 350 MG/ML SOLN COMPARISON:  None. FINDINGS: CTA CHEST FINDINGS Cardiovascular: Preferential opacification of the thoracic aorta. No evidence of thoracic aortic aneurysm or dissection. Normal heart size. No pericardial effusion. Mediastinum/Nodes: No  enlarged mediastinal, hilar, or axillary lymph nodes. Thyroid gland, trachea, and esophagus demonstrate no significant findings. Lungs/Pleura: Lungs are clear. No pleural effusion or pneumothorax. Musculoskeletal: No chest wall abnormality. No acute or significant osseous findings. Review of the MIP images confirms the above findings. CTA ABDOMEN AND PELVIS FINDINGS VASCULAR Aorta: Normal caliber aorta without aneurysm, dissection, vasculitis or significant stenosis. Celiac: Patent without evidence of aneurysm, dissection, vasculitis or significant stenosis. SMA: Patent without evidence of aneurysm, dissection, vasculitis or significant stenosis. Renals: Bilateral renal arteries are patent without evidence of aneurysm, dissection, vasculitis, fibromuscular dysplasia or significant stenosis. Two right renal arteries are noted. IMA: Patent without evidence of aneurysm, dissection, vasculitis or significant stenosis. Inflow: Patent without evidence of aneurysm, dissection, vasculitis or significant stenosis. Veins: No obvious venous abnormality within the limitations of this arterial phase study. Review of the MIP images confirms the above findings. NON-VASCULAR Hepatobiliary: 1.2 cm peripherally enhancing abnormality is noted in left hepatic lobe; MRI is recommended for further evaluation. No gallstones or biliary abnormality is noted. Pancreas: Unremarkable. No pancreatic ductal dilatation or surrounding inflammatory changes. Spleen: Normal in size without focal abnormality. Adrenals/Urinary Tract: Adrenal glands appear normal. 2.7 cm simple cyst is seen arising from lower pole of left kidney. 3.8 cm exophytic rounded abnormality is seen arising from midpole of right kidney which is very complex in appearance and appears to have multiple irregular calcifications and other densities within it. MRI with and without gadolinium is recommended to rule out neoplasm. No hydronephrosis or renal obstruction is noted. No  definite renal or ureteral calculi are noted. Urinary bladder is unremarkable. Stomach/Bowel: Stomach is within normal limits. Appendix appears normal. No evidence of bowel wall thickening, distention, or inflammatory changes. Lymphatic: No significant adenopathy is noted. Reproductive: Prostate is unremarkable. Other: No abdominal wall hernia or abnormality. No abdominopelvic ascites. Musculoskeletal: No acute or significant osseous findings. Review of the MIP images confirms the above findings. IMPRESSION: 1. No evidence of thoracic or abdominal aortic dissection or aneurysm is noted. 2. 1.2 cm peripherally enhancing abnormality is noted in left hepatic lobe; MRI with and without gadolinium is recommended for further evaluation to evaluate for possible neoplasm or malignancy. 3. 3.8 cm exophytic rounded abnormality is seen arising from midpole of right kidney which is very complex in appearance and appears to have multiple irregular calcifications and other densities within it. MRI with and without gadolinium is recommended to rule out neoplasm or malignancy. Electronically Signed   By: Lupita Raider M.D.   On: 01/11/2020 08:40    EKG: Independently reviewed. Repolarization and J point appreciated, sinus rhythm, normal axis.  Assessment/Plan 1-Intractable vomiting: In the setting of gastritis/GERD -Reevaluation in the ED no blood in vomits content appreciated. -patient reports prior hx of GERD,  not on medications currently. -pain improved after IV morphine and viscous lidocaine solution. -will avoid NSAID's and heparin products -started on IV Protonix, Carafate and will slowly advanced diet -IVF's and supportive care -PRN antiemetics and Maalox ordered  2-essential HTN: hypertensive urgency  -no prior medications -pain most likely contributing to elevated levels -Patient has been started on clonidine and as needed hydralazine -Heart healthy diet has been ordered.  3-polysubstance abuse  (including cocaine and marijuana) -UDS positive on presentation -Clonidine will cease with acute intoxication and prevention of withdrawal -Cessation counseling has been provided.  4-tobacco abuse -Cessation counseling provided -Nicotine patch has been ordered.  5-abnormal incidental findings for liver masses/kidney mass appreciated on CT scan: -no red flags provided by patient -patient will need MRI with and w/o gadolinium to further stratify findings; this can be done as an outpatient (currently no MRI availability at [facility]). -LFTs and normal renal function appreciated.  6-hypokalemia -In the setting of GI losses. -Will replete potassium, follow electrolytes trend. -Follow magnesium level.  7-lactic acidosis -In the setting of dehydration from ongoing active vomiting and poor oral intake. -Provide fluid resuscitation -Follow lactic acid -No acute source of infection.   DVT prophylaxis: SCDs Code Status: Full code Family Communication: no family at bedside.  Disposition Plan: anticipate discharge home once able to tolerate diet, BP is controlled and patient re-hydrated Consults called: none  Admission status: Observation, telemetry bed, length of stay less than 2 midnights.   Time Spent: 65 minutes  Barton Dubois MD [facility] Pager [phone]   01/11/2020, 3:52 PM

## 2020-01-11 NOTE — ED Notes (Signed)
Pt is a poor historian and does not know the medications he is taking or supposed to be taken. PT is uncooperative and  during triage. Pt says he has not been taking any of his prescribed medications because he "has felt alright". Says he only takes his medication "when I feel bad".

## 2020-01-11 NOTE — ED Notes (Signed)
CRITICAL VALUE ALERT  Critical Value:  Lactic 3.2   Date & Time Notied:  01/11/20 1055  Provider Notified: Dr. Deretha Emory   Orders Received/Actions taken: None yet

## 2020-01-11 NOTE — Discharge Instructions (Signed)
Take the prilosec and carafate as prescribed. Avoid alcohol, caffeine, NSAIDs, spicy foods. Followup with your primary doctor and the GI doctor. Return to the ED if you develop new or worsening symptoms.

## 2020-01-11 NOTE — ED Triage Notes (Signed)
Pt reports abd pain with vomiting  That started this morning and awoke pt from sleep, pt reports nausea and "indigestion". Pt says he has not taken his Prilosec in weeks. Pt is diaphoretic in triage, moaning, and restless. Pt reports hx of ulcers.

## 2020-01-12 DIAGNOSIS — I10 Essential (primary) hypertension: Secondary | ICD-10-CM

## 2020-01-12 DIAGNOSIS — N289 Disorder of kidney and ureter, unspecified: Secondary | ICD-10-CM

## 2020-01-12 DIAGNOSIS — K769 Liver disease, unspecified: Secondary | ICD-10-CM

## 2020-01-12 DIAGNOSIS — K2900 Acute gastritis without bleeding: Secondary | ICD-10-CM

## 2020-01-12 DIAGNOSIS — E872 Acidosis, unspecified: Secondary | ICD-10-CM

## 2020-01-12 DIAGNOSIS — F1910 Other psychoactive substance abuse, uncomplicated: Secondary | ICD-10-CM

## 2020-01-12 DIAGNOSIS — K219 Gastro-esophageal reflux disease without esophagitis: Secondary | ICD-10-CM

## 2020-01-12 LAB — BASIC METABOLIC PANEL
Anion gap: 8 (ref 5–15)
BUN: 12 mg/dL (ref 6–20)
CO2: 23 mmol/L (ref 22–32)
Calcium: 8.3 mg/dL — ABNORMAL LOW (ref 8.9–10.3)
Chloride: 108 mmol/L (ref 98–111)
Creatinine, Ser: 0.93 mg/dL (ref 0.61–1.24)
GFR calc Af Amer: >60 mL/min (ref >60)
GFR calc non Af Amer: >60 mL/min (ref >60)
Glucose, Bld: 101 mg/dL — ABNORMAL HIGH (ref 70–99)
Potassium: 3.9 mmol/L (ref 3.5–5.1)
Sodium: 139 mmol/L (ref 135–145)

## 2020-01-12 LAB — CBC
HCT: 39.5 % (ref 39.0–52.0)
Hemoglobin: 12.8 g/dL — ABNORMAL LOW (ref 13.0–17.0)
MCH: 32.1 pg (ref 26.0–34.0)
MCHC: 32.4 g/dL (ref 30.0–36.0)
MCV: 99 fL (ref 80.0–100.0)
Platelets: 298 10*3/uL (ref 150–400)
RBC: 3.99 MIL/uL — ABNORMAL LOW (ref 4.22–5.81)
RDW: 14 % (ref 11.5–15.5)
WBC: 9.1 10*3/uL (ref 4.0–10.5)
nRBC: 0 % (ref 0.0–0.2)

## 2020-01-12 LAB — LACTIC ACID, PLASMA: Lactic Acid, Venous: 1.4 mmol/L (ref 0.5–1.9)

## 2020-01-12 LAB — SARS CORONAVIRUS 2 (TAT 6-24 HRS): SARS Coronavirus 2: NEGATIVE

## 2020-01-12 MED ORDER — OMEPRAZOLE 40 MG PO CPDR: 40.0000 mg | DELAYED_RELEASE_CAPSULE | Freq: Every day | ORAL | 2 refills | Status: DC

## 2020-01-12 MED ORDER — SUCRALFATE 1 G PO TABS: 1.0000 g | ORAL_TABLET | Freq: Three times a day (TID) | ORAL | 0 refills | Status: DC

## 2020-01-12 MED ORDER — CLONIDINE HCL 0.1 MG PO TABS: 0.1000 mg | ORAL_TABLET | Freq: Two times a day (BID) | ORAL | 2 refills | Status: DC

## 2020-01-12 MED ORDER — NICOTINE 21 MG/24HR TD PT24: 21.0000 mg | MEDICATED_PATCH | Freq: Every day | TRANSDERMAL | 0 refills | Status: DC

## 2020-01-12 MED ORDER — ACETAMINOPHEN 325 MG PO TABS
650.0000 mg | ORAL_TABLET | Freq: Four times a day (QID) | ORAL | Status: DC | PRN
  Administered 2020-01-12: 650 mg via ORAL
  Filled 2020-01-12: qty 2

## 2020-01-12 NOTE — Discharge Summary (Signed)
Physician Discharge Summary  Gary [name] TTS:[id number] DOB: 1966-01-29 DOA: 01/11/2020  PCP: Patient, No Pcp Per  Admit date: 01/11/2020 Discharge date: 01/12/2020  Time spent: 35 minutes  Recommendations for Outpatient Follow-up:  1. Repeat BMET to follow electrolytes and renal funciton 2. Continue assisting with polysubstance abuse cessation 3. Follow up MRI results and direct needed referral to GI and/or Urology service as needed 4. Reassess BP and adjust antihypertensive regimen as needed.    Discharge Diagnoses:  Principal Problem:   Intractable vomiting Active Problems:   Acute gastritis without hemorrhage   Essential hypertension   Lactic acidosis   Gastroesophageal reflux disease   Polysubstance abuse (HCC)   Lesion of liver   Lesion of native kidney   Discharge Condition: stable and improved. Discharged home with instructions to follow up with instructions for MRI studies and to establish care with a PCP  Code status: full code.  Diet recommendation: heart healthy diet  Filed Weights   01/11/20 0612 01/11/20 1528  Weight: 95.3 kg 92.5 kg    History of present illness:  54 y.o. 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  Patient reports her symptoms started sudden and woke him up from sleep.  He expressed drinking some beers and using some recreational drugs the day prior to symptom development.  Reports no shortness of breath, no fever, no focal weakness, no dysuria, no hematuria, no weight loss, no blood in his stools, no melena or hematemesis.  Reports only gastric content in his vomitus.   In the ED patient expressed pain to be the worst that he has ever experienced 54 year old 10 in intensity, localized in his mid epigastric area without radiation with  associated symptoms of nausea/vomiting (he ended up vomiting 4 times while in the emergency department) and couple times prior to come to the ED.  Pain got better after IV morphine and Viscous Lidocaine.  Imaging studies demonstrated liver mass and kidney mass (unable to characterized) ; no other acute abnormalities reported.  Negative troponin x2 and EKG which was reviewed by on-call cardiologist along with EDP demonstrating early repolarization and no frank ischemic changes.  The rest of patient's blood work was unremarkable except for lactic acid of 3.2, mild hypokalemia and mild elevation of his lipase.  Patient received IV fluids, pain medication, PPI and antibiotics.  [facility] contacted to me patient for further evaluation and management.  Hospital Course:  1-Intractable vomiting: In the setting of gastritis/GERD -patient reports prior hx of GERD, not on medications prior to admission.  -pain improved after IV morphine and viscous lidocaine solution. -Patient encouraged to avoid NSAID's, tobacco and alcohol use. -started on IV Protonix and Carafate; symptoms drastically improved, no signs of overt bleeding appreciated. Discharge on 10 days TID of Carafate and also BID PPI. Asked to follow up with GI service as an outpatient.   -IVF's and supportive care provided with excellent results.  -PRN antiemetics and Maalox used while inpatient.   2-essential HTN: hypertensive urgency  -not on prior medications -pain was most likely contributing to elevated levels. -Patient has been started on clonidine and asked to follow heart healthy diet.   3-polysubstance abuse (including cocaine and marijuana) -UDS positive on presentation for both substances. -Clonidine will cease with acute intoxication and prevention of withdrawal -Cessation counseling has been provided. -patient was receptive  to counseling.   4-tobacco abuse -Cessation counseling provided -Nicotine patch has been ordered at  discharge.  5-abnormal incidental findings for liver/kidney lesion appreciated on CT scan: -no red flags provided by patient -patient will need MRI with and w/o gadolinium to further stratify findings; this can be done as an outpatient (currently no MRI availability at [facility]). -LFTs and normal renal function appreciated. -outpatient follow up with GI/urology as needed.  6-hypokalemia -In the setting of GI losses. -repleted and WNL at discharge -Mg within normal range  7-lactic acidosis -In the setting of dehydration from ongoing vomiting and poor oral intake prior to admission. -presumably use of recreational drugs also play a role. -after appropriate IVF's resuscitation lactic acid back to normal range -no source of infection appreciated -no antibiotics given.   Procedures:  See below for x-ray reports.   Consultations:  None   Discharge Exam: Vitals:   01/11/20 2330 01/12/20 0319  BP: 129/78 130/85  Pulse: 71 67  Resp: 17 20  Temp: 98.5 F (36.9 C) 98.9 F (37.2 C)  SpO2: 98% 100%    General: afebrile, no icterus, no jaundice, no CP, no SOB, no nausea, no vomiting and no abd pain at discharge. Ready to go home.  Cardiovascular: S1 and S2, no rubs, no gallops, no murmur, no JVD Respiratory: CTA bilaterally Abd: soft, NT, ND, positive BS, no guarding Extremities: no edema, no cyanosis, no clubbing.   Discharge Instructions   Discharge Instructions    Diet - low sodium heart healthy   Complete by: As directed    Discharge instructions   Complete by: As directed    Stop smoking and stop use of recreational drugs. Maintain adequate hydration. Avoid the use of NSAID's (Advil, Excedrin, Aspirin, Goody Powder, BC, naproxen, Aleve, Motrin, Ibuprofen) Follow heart healthy diet. Take medications as prescribed. Follow with instructions and appointment for outpatient MRI     Allergies as of 01/12/2020   No Known Allergies     Medication List     TAKE these medications   cloNIDine 0.1 MG tablet Commonly known as: CATAPRES Take 1 tablet (0.1 mg total) by mouth 2 (two) times daily.   nicotine 21 mg/24hr patch Commonly known as: NICODERM CQ - dosed in mg/24 hours Place 1 patch (21 mg total) onto the skin daily. Start taking on: January 13, 2020   omeprazole 40 MG capsule Commonly known as: PRILOSEC Take 1 capsule (40 mg total) by mouth daily.   sucralfate 1 g tablet Commonly known as: Carafate Take 1 tablet (1 g total) by mouth in the morning, at noon, and at bedtime.      No Known Allergies Follow-up Information    [facility]. Schedule an appointment as soon as possible for a visit in 1 week(s).   Contact information: [address] [place] [address] [phone]        West Bali, MD. Schedule an appointment as soon as possible for a visit in 2 week(s).   Specialty: Gastroenterology Contact information: [address] [place] [address] [phone]           The results of significant diagnostics from this hospitalization (including imaging, microbiology, ancillary and laboratory) are listed below for reference.    Significant Diagnostic Studies: DG Chest Portable 1 View  Result Date: 01/11/2020 CLINICAL DATA:  Chest pain. EXAM: PORTABLE CHEST 1 VIEW COMPARISON:  No pertinent prior studies available for comparison. FINDINGS: Heart size within normal limits. No evidence of  airspace consolidation within the lungs. No evidence of pleural effusion or pneumothorax. No acute bony abnormality. Overlying cardiac monitoring leads. IMPRESSION: No evidence of acute cardiopulmonary abnormality. Electronically Signed   By: Jackey Loge DO   On: 01/11/2020 07:05   DG Abd Portable 2 Views  Result Date: 01/11/2020 CLINICAL DATA:  Abdominal pain. Additional history provided: Patient reports abdominal pain with vomiting that began this morning and woke patient from sleep, patient reports nausea  and indigestion. EXAM: PORTABLE ABDOMEN - 2 VIEW COMPARISON:  Abdominal ultrasound 07/11/2007, report from CT abdomen 09/25/2002 (images unavailable) FINDINGS: No dilated loops of small bowel are demonstrated to suggest small bowel obstruction. Moderate stool burden within the colon. Evaluation for free air is limited on supine radiography. No acute bony abnormality. Punctate metallic hyperdensity projects over the left femoral head which may be within or external to the patient. IMPRESSION: No radiographic evidence of obstruction. Electronically Signed   By: Jackey Loge DO   On: 01/11/2020 07:04   CT Angio Chest/Abd/Pel for Dissection W and/or Wo Contrast  Result Date: 01/11/2020 CLINICAL DATA:  Acute chest and abdominal pain. EXAM: CT ANGIOGRAPHY CHEST, ABDOMEN AND PELVIS TECHNIQUE: Multidetector CT imaging through the chest, abdomen and pelvis was performed using the standard protocol during bolus administration of intravenous contrast. Multiplanar reconstructed images and MIPs were obtained and reviewed to evaluate the vascular anatomy. CONTRAST:  OMNIPAQUE IOHEXOL 350 MG/ML SOLN COMPARISON:  None. FINDINGS: CTA CHEST FINDINGS Cardiovascular: Preferential opacification of the thoracic aorta. No evidence of thoracic aortic aneurysm or dissection. Normal heart size. No pericardial effusion. Mediastinum/Nodes: No enlarged mediastinal, hilar, or axillary lymph nodes. Thyroid gland, trachea, and esophagus demonstrate no significant findings. Lungs/Pleura: Lungs are clear. No pleural effusion or pneumothorax. Musculoskeletal: No chest wall abnormality. No acute or significant osseous findings. Review of the MIP images confirms the above findings. CTA ABDOMEN AND PELVIS FINDINGS VASCULAR Aorta: Normal caliber aorta without aneurysm, dissection, vasculitis or significant stenosis. Celiac: Patent without evidence of aneurysm, dissection, vasculitis or significant stenosis. SMA: Patent without evidence of  aneurysm, dissection, vasculitis or significant stenosis. Renals: Bilateral renal arteries are patent without evidence of aneurysm, dissection, vasculitis, fibromuscular dysplasia or significant stenosis. Two right renal arteries are noted. IMA: Patent without evidence of aneurysm, dissection, vasculitis or significant stenosis. Inflow: Patent without evidence of aneurysm, dissection, vasculitis or significant stenosis. Veins: No obvious venous abnormality within the limitations of this arterial phase study. Review of the MIP images confirms the above findings. NON-VASCULAR Hepatobiliary: 1.2 cm peripherally enhancing abnormality is noted in left hepatic lobe; MRI is recommended for further evaluation. No gallstones or biliary abnormality is noted. Pancreas: Unremarkable. No pancreatic ductal dilatation or surrounding inflammatory changes. Spleen: Normal in size without focal abnormality. Adrenals/Urinary Tract: Adrenal glands appear normal. 2.7 cm simple cyst is seen arising from lower pole of left kidney. 3.8 cm exophytic rounded abnormality is seen arising from midpole of right kidney which is very complex in appearance and appears to have multiple irregular calcifications and other densities within it. MRI with and without gadolinium is recommended to rule out neoplasm. No hydronephrosis or renal obstruction is noted. No definite renal or ureteral calculi are noted. Urinary bladder is unremarkable. Stomach/Bowel: Stomach is within normal limits. Appendix appears normal. No evidence of bowel wall thickening, distention, or inflammatory changes. Lymphatic: No significant adenopathy is noted. Reproductive: Prostate is unremarkable. Other: No abdominal wall hernia or abnormality. No abdominopelvic ascites. Musculoskeletal: No acute or significant osseous findings. Review of the MIP images  confirms the above findings. IMPRESSION: 1. No evidence of thoracic or abdominal aortic dissection or aneurysm is noted. 2. 1.2  cm peripherally enhancing abnormality is noted in left hepatic lobe; MRI with and without gadolinium is recommended for further evaluation to evaluate for possible neoplasm or malignancy. 3. 3.8 cm exophytic rounded abnormality is seen arising from midpole of right kidney which is very complex in appearance and appears to have multiple irregular calcifications and other densities within it. MRI with and without gadolinium is recommended to rule out neoplasm or malignancy. Electronically Signed   By: Marijo Conception M.D.   On: 01/11/2020 08:40    Microbiology: Recent Results (from the past 240 hour(s))  SARS CORONAVIRUS 2 (TAT 6-24 HRS) Nasopharyngeal Nasopharyngeal Swab     Status: None   Collection Time: 01/11/20  1:05 PM   Specimen: Nasopharyngeal Swab  Result Value Ref Range Status   SARS Coronavirus 2 NEGATIVE NEGATIVE Final    Comment: (NOTE) SARS-CoV-2 target nucleic acids are NOT DETECTED. The SARS-CoV-2 RNA is generally detectable in upper and lower respiratory specimens during the acute phase of infection. Negative results do not preclude SARS-CoV-2 infection, do not rule out co-infections with other pathogens, and should not be used as the sole basis for treatment or other patient management decisions. Negative results must be combined with clinical observations, patient history, and epidemiological information. The expected result is Negative. Fact Sheet for Patients: SugarRoll.be Fact Sheet for Healthcare Providers: https://www.woods-mathews.com/ This test is not yet approved or cleared by the [place] FDA and  has been authorized for detection and/or diagnosis of SARS-CoV-2 by FDA under an Emergency Use Authorization (EUA). This EUA will remain  in effect (meaning this test can be used) for the duration of the COVID-19 declaration under Section 56 4(b)(1) of the Act, 21 U.S.C. section 360bbb-3(b)(1), unless the authorization is  terminated or revoked sooner. Performed at [facility], [place] [address], [place], [place] [address]      Labs: Basic Metabolic Panel: Recent Labs  Lab 01/11/20 0610 01/11/20 1212 01/12/20 0448  NA 140  --  139  K 3.2*  --  3.9  CL 103  --  108  CO2 28  --  23  GLUCOSE 127*  --  101*  BUN 14  --  12  CREATININE 1.09  --  0.93  CALCIUM 9.0  --  8.3*  MG  --  1.9  --    Liver Function Tests: Recent Labs  Lab 01/11/20 0610  AST 32  ALT 43  ALKPHOS 53  BILITOT 0.3  PROT 6.9  ALBUMIN 4.1   Recent Labs  Lab 01/11/20 0610  LIPASE 90*   CBC: Recent Labs  Lab 01/11/20 0610 01/12/20 0448  WBC 6.9 9.1  NEUTROABS 2.0  --   HGB 14.9 12.8*  HCT 46.0 39.5  MCV 98.7 99.0  PLT 357 298   CBG: Recent Labs  Lab 01/11/20 0620  GLUCAP 114*    Signed:  Barton Dubois MD.  [facility] 01/12/2020, 11:14 AM

## 2020-05-30 ENCOUNTER — Other Ambulatory Visit: Payer: Self-pay

## 2020-05-30 ENCOUNTER — Emergency Department (HOSPITAL_COMMUNITY)
Admission: EM | Admit: 2020-05-30 | Discharge: 2020-05-30 | Disposition: A | Discharge status: Home / Self Care | Payer: Self-pay | Attending: Emergency Medicine | Admitting: Emergency Medicine

## 2020-05-30 ENCOUNTER — Encounter (HOSPITAL_COMMUNITY): Payer: Self-pay | Admitting: *Deleted

## 2020-05-30 ENCOUNTER — Emergency Department (HOSPITAL_COMMUNITY): Payer: Self-pay

## 2020-05-30 DIAGNOSIS — Z79899 Other long term (current) drug therapy: Secondary | ICD-10-CM | POA: Insufficient documentation

## 2020-05-30 DIAGNOSIS — F17200 Nicotine dependence, unspecified, uncomplicated: Secondary | ICD-10-CM | POA: Insufficient documentation

## 2020-05-30 DIAGNOSIS — M25561 Pain in right knee: Secondary | ICD-10-CM | POA: Insufficient documentation

## 2020-05-30 DIAGNOSIS — M25562 Pain in left knee: Secondary | ICD-10-CM | POA: Insufficient documentation

## 2020-05-30 DIAGNOSIS — I10 Essential (primary) hypertension: Secondary | ICD-10-CM | POA: Insufficient documentation

## 2020-05-30 DIAGNOSIS — R52 Pain, unspecified: Secondary | ICD-10-CM

## 2020-05-30 MED ORDER — DEXAMETHASONE 4 MG PO TABS
4.0000 mg | ORAL_TABLET | Freq: Two times a day (BID) | ORAL | 0 refills | Status: DC
Start: 2020-05-30 — End: 2020-08-17

## 2020-05-30 NOTE — ED Triage Notes (Signed)
Bilateral knee pain for over a week

## 2020-05-30 NOTE — ED Notes (Signed)
Pt asks for soda as well as lunch prior to assessment Provided   Pt reports bilateral knee pain after playing basketball 1 week ago   Has no PCP nor ortho   Knee is not tender to palpation, nor is it swollen   He has been to [employer] [employer]

## 2020-06-01 NOTE — ED Provider Notes (Signed)
[facility] EMERGENCY DEPARTMENT Provider Note   CSN: [id number] Arrival date & time: 05/30/20  1335     History Chief Complaint  Patient presents with  . Knee Pain    Gary [name] is a 54 y.o. male.  HPI   54 year old male with bilateral knee pain.  Worsening over the past several days.  Denies acute trauma or strain.  No fevers.  No joint swelling.  No redness.  Denies any known history of gout.  Past Medical History:  Diagnosis Date  . Acid reflux   . High cholesterol   . History of stomach ulcers   . Hypertension   . Pancreatitis, alcoholic, acute     Patient Active Problem List   Diagnosis Date Noted  . Acute gastritis without hemorrhage   . Essential hypertension   . Lactic acidosis   . Gastroesophageal reflux disease   . Polysubstance abuse (HCC)   . Lesion of liver   . Lesion of native kidney   . Intractable vomiting 01/11/2020    History reviewed. No pertinent surgical history.     No family history on file.  Social History   Tobacco Use  . Smoking status: Current Every Day Smoker  . Smokeless tobacco: Never Used  Substance Use Topics  . Alcohol use: Not Currently    Comment: pt states he has not consumed alcohol in 10 years  . Drug use: Not Currently    Home Medications Prior to Admission medications   Medication Sig Start Date End Date Taking? Authorizing Provider  cloNIDine (CATAPRES) 0.1 MG tablet Take 1 tablet (0.1 mg total) by mouth 2 (two) times daily. 01/12/20   Vassie Loll, MD  dexamethasone (DECADRON) 4 MG tablet Take 1 tablet (4 mg total) by mouth 2 (two) times daily. 05/30/20   Raeford Razor, MD  nicotine (NICODERM CQ - DOSED IN MG/24 HOURS) 21 mg/24hr patch Place 1 patch (21 mg total) onto the skin daily. 01/13/20   Vassie Loll, MD  omeprazole (PRILOSEC) 40 MG capsule Take 1 capsule (40 mg total) by mouth daily. 01/12/20   Vassie Loll, MD  sucralfate (CARAFATE) 1 g tablet Take 1 tablet (1 g total) by mouth in the  morning, at noon, and at bedtime. 01/12/20   Vassie Loll, MD    Allergies    Patient has no known allergies.  Review of Systems   Review of Systems All systems reviewed and negative, other than as noted in HPI. Physical Exam Updated Vital Signs BP (!) 122/94 (BP Location: Right Arm)   Pulse 90   Temp 98.2 F (36.8 C) (Oral)   Resp 18   Ht 5\' 11"  (1.803 m)   Wt 88.5 kg   SpO2 100%   BMI 27.20 kg/m   Physical Exam Vitals and nursing note reviewed.  Constitutional:      General: He is not in acute distress.    Appearance: He is well-developed.  HENT:     Head: Normocephalic and atraumatic.  Eyes:     General:        Right eye: No discharge.        Left eye: No discharge.     Conjunctiva/sclera: Conjunctivae normal.  Cardiovascular:     Rate and Rhythm: Normal rate and regular rhythm.     Heart sounds: Normal heart sounds. No murmur heard.  No friction rub. No gallop.   Pulmonary:     Effort: Pulmonary effort is normal. No respiratory distress.     Breath  sounds: Normal breath sounds.  Abdominal:     General: There is no distension.     Palpations: Abdomen is soft.     Tenderness: There is no abdominal tenderness.  Musculoskeletal:        General: No swelling, tenderness or signs of injury. Normal range of motion.     Cervical back: Neck supple.  Skin:    General: Skin is warm and dry.  Neurological:     Mental Status: He is alert.  Psychiatric:        Behavior: Behavior normal.        Thought Content: Thought content normal.     ED Results / Procedures / Treatments   Labs (all labs ordered are listed, but only abnormal results are displayed) Labs Reviewed - No data to display  EKG None  Radiology No results found.   DG Knee 1-2 Views Left  Result Date: 05/30/2020 CLINICAL DATA:  Pt c/o bilateral knee pain and medial swelling x 1 week. Pt states he believes he has arthritis. Pt states pain made worse after playing basketball. NKI EXAM: LEFT KNEE -  1-2 VIEW COMPARISON:  None. FINDINGS: No fracture of the proximal tibia or distal femur. Patella is normal. No joint effusion. No significant arthropathy. IMPRESSION: No acute findings. No significant arthropathy identified. No joint effusion. Electronically Signed   By: Genevive Bi M.D.   On: 05/30/2020 15:13   DG Knee 1-2 Views Right  Result Date: 05/30/2020 CLINICAL DATA:  Bilateral knee pain, medial swelling EXAM: RIGHT KNEE - 1-2 VIEW COMPARISON:  None. FINDINGS: Degenerative changes most pronounced in the patellofemoral compartment with joint space narrowing and spurring. Small joint effusion. No acute bony abnormality. Specifically, no fracture, subluxation, or dislocation. Small calix foreign body seen within the soft tissues in the proximal lateral calf. IMPRESSION: Degenerative changes, most pronounced in the patellofemoral compartment with small joint effusion. No acute bony abnormality. Electronically Signed   By: Charlett Nose M.D.   On: 05/30/2020 15:13     Procedures Procedures (including critical care time)  Medications Ordered in ED Medications - No data to display  ED Course  I have reviewed the triage vital signs and the nursing notes.  Pertinent labs & imaging results that were available during my care of the patient were reviewed by me and considered in my medical decision making (see chart for details).    MDM Rules/Calculators/A&P                          54 year old male with bilateral knee pain.  Exam is actually pretty unremarkable.  Likely DJD.  We will give him a course of steroids.  Outpatient follow-up.  Final Clinical Impression(s) / ED Diagnoses Final diagnoses:  Pain  Pain in both knees, unspecified chronicity    Rx / DC Orders ED Discharge Orders         Ordered    dexamethasone (DECADRON) 4 MG tablet  2 times daily     Discontinue  Reprint     05/30/20 1550           Raeford Razor, MD 06/01/20 1953

## 2020-06-23 ENCOUNTER — Ambulatory Visit: Payer: Self-pay | Admitting: Physician Assistant

## 2020-06-23 MED ORDER — LIDOCAINE-EPINEPHRINE (PF) 1 %-1:200000 IJ SOLN
INTRAMUSCULAR | Status: AC
  Filled 2020-06-23: qty 30

## 2020-06-23 MED ORDER — SODIUM BICARBONATE 4.2 % IV SOLN
INTRAVENOUS | Status: AC
  Filled 2020-06-23: qty 10

## 2020-06-23 MED ORDER — LIDOCAINE HCL (PF) 2 % IJ SOLN
INTRAMUSCULAR | Status: AC
  Filled 2020-06-23: qty 10

## 2020-07-09 ENCOUNTER — Ambulatory Visit: Payer: Self-pay | Admitting: Physician Assistant

## 2020-07-23 ENCOUNTER — Ambulatory Visit: Payer: Self-pay | Admitting: Physician Assistant

## 2020-08-17 ENCOUNTER — Other Ambulatory Visit: Payer: Self-pay | Admitting: Physician Assistant

## 2020-08-17 ENCOUNTER — Ambulatory Visit: Payer: Self-pay | Admitting: Physician Assistant

## 2020-08-17 ENCOUNTER — Encounter: Payer: Self-pay | Admitting: Physician Assistant

## 2020-08-17 VITALS — BP 150/90 | HR 102 | Ht 68.0 in | Wt 157.2 lb

## 2020-08-17 DIAGNOSIS — Z7689 Persons encountering health services in other specified circumstances: Secondary | ICD-10-CM

## 2020-08-17 DIAGNOSIS — M1710 Unilateral primary osteoarthritis, unspecified knee: Secondary | ICD-10-CM

## 2020-08-17 DIAGNOSIS — M25562 Pain in left knee: Secondary | ICD-10-CM

## 2020-08-17 DIAGNOSIS — G8929 Other chronic pain: Secondary | ICD-10-CM

## 2020-08-17 DIAGNOSIS — F1910 Other psychoactive substance abuse, uncomplicated: Secondary | ICD-10-CM

## 2020-08-17 DIAGNOSIS — Z125 Encounter for screening for malignant neoplasm of prostate: Secondary | ICD-10-CM

## 2020-08-17 DIAGNOSIS — R634 Abnormal weight loss: Secondary | ICD-10-CM

## 2020-08-17 DIAGNOSIS — D649 Anemia, unspecified: Secondary | ICD-10-CM

## 2020-08-17 DIAGNOSIS — I10 Essential (primary) hypertension: Secondary | ICD-10-CM

## 2020-08-17 DIAGNOSIS — Z1211 Encounter for screening for malignant neoplasm of colon: Secondary | ICD-10-CM

## 2020-08-17 DIAGNOSIS — R160 Hepatomegaly, not elsewhere classified: Secondary | ICD-10-CM

## 2020-08-17 DIAGNOSIS — Z8639 Personal history of other endocrine, nutritional and metabolic disease: Secondary | ICD-10-CM

## 2020-08-17 DIAGNOSIS — N2889 Other specified disorders of kidney and ureter: Secondary | ICD-10-CM

## 2020-08-17 DIAGNOSIS — R935 Abnormal findings on diagnostic imaging of other abdominal regions, including retroperitoneum: Secondary | ICD-10-CM

## 2020-08-17 DIAGNOSIS — M179 Osteoarthritis of knee, unspecified: Secondary | ICD-10-CM

## 2020-08-17 DIAGNOSIS — F17200 Nicotine dependence, unspecified, uncomplicated: Secondary | ICD-10-CM

## 2020-08-17 MED ORDER — LISINOPRIL 10 MG PO TABS
10.0000 mg | ORAL_TABLET | Freq: Every day | ORAL | 1 refills | Status: DC
Start: 2020-08-17 — End: 2020-08-17

## 2020-08-17 MED ORDER — METOPROLOL TARTRATE 50 MG PO TABS
50.0000 mg | ORAL_TABLET | Freq: Two times a day (BID) | ORAL | 1 refills | Status: DC
Start: 2020-08-17 — End: 2021-07-24

## 2020-08-17 NOTE — Progress Notes (Signed)
BP (!) 150/90   Pulse (!) 102   Ht 5\' 8"  (1.727 m)   Wt 157 lb 4 oz (71.3 kg)   SpO2 99%   BMI 23.91 kg/m    Subjective:    Patient ID: , male    DOB: 10/29/1965, 54 y.o.   MRN: [id number]  HPI: Gary [name] is a 54 y.o. male presenting on 08/17/2020 for New Patient (Initial Visit) and Weight Loss (pt states he previously weighed 210 about 6 months ago pt states he has not been purposefully trying to lose weight)   HPI   Pt had a negative covid 19 screening questionnaire.    Pt is a 54yoM who presents to establish care.   Pt was on lisinopril and cholesterol medication while he was incarcerated.  He hasn't been on BP meds in about a year since he got out.  He says he was incarcerated for 10 years.  He works for a 08/19/2020.  Pt went to hospital in March for abdominal pain.  A CT scan that was done revealed a Liver mass and a kidney mass with recomendatinos for MRI with and without gadolinium.   It was also recommended that he follow up with PCP.   Pt was no-show to two previously scheduled appointments to establish care in this office.  Of note, chest CTA done at that time was unremarkable.   He got out of incarceration october 2020.  He doesn't think his weight loss is due to dietary changes since getting out.  He says his appetite is fine and he says he is eating.  He never had a colonoscopy.  Pt says he sometimes feels like his heart is going fast but he denies CP, SOB.       Relevant past medical, surgical, family and social history reviewed and updated as indicated. Interim medical history since our last visit reviewed. Allergies and medications reviewed and updated.   No current outpatient medications on file.     Review of Systems  Per HPI unless specifically indicated above     Objective:    BP (!) 150/90   Pulse (!) 102   Ht 5\' 8"  (1.727 m)   Wt 157 lb 4 oz (71.3 kg)   SpO2 99%   BMI 23.91 kg/m   Wt Readings from  Last 3 Encounters:  08/17/20 157 lb 4 oz (71.3 kg)  05/30/20 195 lb (88.5 kg)  01/11/20 203 lb 14.8 oz (92.5 kg)    Physical Exam Vitals reviewed.  Constitutional:      General: He is not in acute distress.    Appearance: He is well-developed.  HENT:     Head: Normocephalic and atraumatic.  Eyes:     Conjunctiva/sclera: Conjunctivae normal.     Pupils: Pupils are equal, round, and reactive to light.  Neck:     Thyroid: No thyromegaly.  Cardiovascular:     Rate and Rhythm: Normal rate and regular rhythm.  Pulmonary:     Effort: Pulmonary effort is normal.     Breath sounds: Normal breath sounds. No wheezing or rales.  Abdominal:     General: Bowel sounds are normal.     Palpations: Abdomen is soft. There is no mass.     Tenderness: There is no abdominal tenderness.  Musculoskeletal:     Cervical back: Neck supple.     Right lower leg: No edema.     Left lower leg: No edema.  Lymphadenopathy:  Cervical: No cervical adenopathy.  Skin:    General: Skin is warm and dry.     Findings: No rash.  Neurological:     Mental Status: He is alert and oriented to person, place, and time.     Deep Tendon Reflexes:     Reflex Scores:      Patellar reflexes are 2+ on the right side and 2+ on the left side. Psychiatric:        Behavior: Behavior normal.        Thought Content: Thought content normal.            Assessment & Plan:    Encounter Diagnoses  Name Primary?  . Encounter to establish care Yes  . Primary hypertension   . Liver mass   . Kidney mass   . Abnormal CT of the abdomen   . Weight loss   . Tobacco use disorder   . Polysubstance abuse (HCC)   . History of hyperlipidemia   . Screening for prostate cancer   . Screening for colon cancer   . Chronic pain of both knees   . Osteoarthritis of knee, unspecified laterality, unspecified osteoarthritis type   . Other specified disorders of kidney and ureter   . Anemia, unspecified type      -Pt is given  CAFA/application for [employer] financial assistance.  [facility] is contacted to assist pt with the paperwork -will order MRI to evaluate live and kidney masses -will refer to Orthopedics for his knees when he is approved for CAFA -will check Labs -pt is given ifobt for colon cancer screening -rx metoprolol for the bp.  It should help with rate as well. -pt is given application for [employer].medication assistance program -pt is educated and encouraged to get covid vaccination -pt to follow up 2-3 weeks.  He is to contact office sooner prn

## 2020-08-17 NOTE — Patient Instructions (Addendum)
[facility] -  [phone]

## 2020-08-20 ENCOUNTER — Encounter: Payer: Self-pay | Admitting: Physician Assistant

## 2020-08-20 LAB — IFOBT (OCCULT BLOOD): IFOBT: NEGATIVE

## 2020-08-31 ENCOUNTER — Ambulatory Visit (HOSPITAL_COMMUNITY): Payer: Self-pay

## 2020-09-05 ENCOUNTER — Other Ambulatory Visit: Payer: Self-pay

## 2020-09-05 ENCOUNTER — Encounter (HOSPITAL_COMMUNITY): Payer: Self-pay | Admitting: *Deleted

## 2020-09-05 ENCOUNTER — Emergency Department (HOSPITAL_COMMUNITY)
Admission: EM | Admit: 2020-09-05 | Discharge: 2020-09-05 | Disposition: A | Discharge status: Home / Self Care | Payer: Self-pay | Attending: Emergency Medicine | Admitting: Emergency Medicine

## 2020-09-05 DIAGNOSIS — Z79899 Other long term (current) drug therapy: Secondary | ICD-10-CM | POA: Insufficient documentation

## 2020-09-05 DIAGNOSIS — R112 Nausea with vomiting, unspecified: Secondary | ICD-10-CM

## 2020-09-05 DIAGNOSIS — R110 Nausea: Secondary | ICD-10-CM | POA: Insufficient documentation

## 2020-09-05 DIAGNOSIS — F17200 Nicotine dependence, unspecified, uncomplicated: Secondary | ICD-10-CM | POA: Insufficient documentation

## 2020-09-05 DIAGNOSIS — R1013 Epigastric pain: Secondary | ICD-10-CM | POA: Insufficient documentation

## 2020-09-05 DIAGNOSIS — I10 Essential (primary) hypertension: Secondary | ICD-10-CM | POA: Insufficient documentation

## 2020-09-05 DIAGNOSIS — K219 Gastro-esophageal reflux disease without esophagitis: Secondary | ICD-10-CM | POA: Insufficient documentation

## 2020-09-05 DIAGNOSIS — F1910 Other psychoactive substance abuse, uncomplicated: Secondary | ICD-10-CM

## 2020-09-05 DIAGNOSIS — E860 Dehydration: Secondary | ICD-10-CM

## 2020-09-05 LAB — COMPREHENSIVE METABOLIC PANEL
ALT: 41 U/L (ref 0–44)
AST: 36 U/L (ref 15–41)
Albumin: 4.3 g/dL (ref 3.5–5.0)
Alkaline Phosphatase: 54 U/L (ref 38–126)
Anion gap: 9 (ref 5–15)
BUN: 18 mg/dL (ref 6–20)
CO2: 29 mmol/L (ref 22–32)
Calcium: 9.6 mg/dL (ref 8.9–10.3)
Chloride: 102 mmol/L (ref 98–111)
Creatinine, Ser: 1.11 mg/dL (ref 0.61–1.24)
GFR, Estimated: >60 mL/min (ref >60)
Glucose, Bld: 85 mg/dL (ref 70–99)
Potassium: 3.7 mmol/L (ref 3.5–5.1)
Sodium: 140 mmol/L (ref 135–145)
Total Bilirubin: 0.5 mg/dL (ref 0.3–1.2)
Total Protein: 7.3 g/dL (ref 6.5–8.1)

## 2020-09-05 LAB — CBC WITH DIFFERENTIAL/PLATELET
Abs Immature Granulocytes: 0.01 10*3/uL (ref 0.00–0.07)
Basophils Absolute: 0 10*3/uL (ref 0.0–0.1)
Basophils Relative: 0 %
Eosinophils Absolute: 0.1 10*3/uL (ref 0.0–0.5)
Eosinophils Relative: 2 %
HCT: 49.3 % (ref 39.0–52.0)
Hemoglobin: 16.1 g/dL (ref 13.0–17.0)
Immature Granulocytes: 0 %
Lymphocytes Relative: 33 %
Lymphs Abs: 2 10*3/uL (ref 0.7–4.0)
MCH: 32.5 pg (ref 26.0–34.0)
MCHC: 32.7 g/dL (ref 30.0–36.0)
MCV: 99.4 fL (ref 80.0–100.0)
Monocytes Absolute: 0.6 10*3/uL (ref 0.1–1.0)
Monocytes Relative: 11 %
Neutro Abs: 3.2 10*3/uL (ref 1.7–7.7)
Neutrophils Relative %: 54 %
Platelets: 384 10*3/uL (ref 150–400)
RBC: 4.96 MIL/uL (ref 4.22–5.81)
RDW: 13.6 % (ref 11.5–15.5)
WBC: 5.9 10*3/uL (ref 4.0–10.5)
nRBC: 0 % (ref 0.0–0.2)

## 2020-09-05 LAB — RAPID URINE DRUG SCREEN, HOSP PERFORMED
Amphetamines: NOT DETECTED
Barbiturates: NOT DETECTED
Benzodiazepines: NOT DETECTED
Cocaine: POSITIVE — AB
Opiates: NOT DETECTED
Tetrahydrocannabinol: POSITIVE — AB

## 2020-09-05 LAB — URINALYSIS, ROUTINE W REFLEX MICROSCOPIC
Bilirubin Urine: NEGATIVE
Glucose, UA: 50 mg/dL — AB
Hgb urine dipstick: NEGATIVE
Ketones, ur: 20 mg/dL — AB
Leukocytes,Ua: NEGATIVE
Nitrite: NEGATIVE
Protein, ur: NEGATIVE mg/dL
Specific Gravity, Urine: 1.011 (ref 1.005–1.030)
pH: 7 (ref 5.0–8.0)

## 2020-09-05 LAB — LIPASE, BLOOD: Lipase: 84 U/L — ABNORMAL HIGH (ref 11–51)

## 2020-09-05 LAB — ETHANOL: Alcohol, Ethyl (B): <10 mg/dL (ref <10)

## 2020-09-05 MED ORDER — ONDANSETRON 8 MG PO TBDP
8.0000 mg | ORAL_TABLET | Freq: Three times a day (TID) | ORAL | 0 refills | Status: DC | PRN
Start: 2020-09-05 — End: 2021-07-24

## 2020-09-05 MED ORDER — ONDANSETRON HCL 4 MG/2ML IJ SOLN
4.0000 mg | Freq: Once | INTRAMUSCULAR | Status: AC
  Administered 2020-09-05: 4 mg via INTRAVENOUS
  Filled 2020-09-05: qty 2

## 2020-09-05 MED ORDER — HYDROMORPHONE HCL 1 MG/ML IJ SOLN
0.5000 mg | Freq: Once | INTRAMUSCULAR | Status: AC
  Administered 2020-09-05: 0.5 mg via INTRAVENOUS
  Filled 2020-09-05: qty 1

## 2020-09-05 MED ORDER — FAMOTIDINE IN NACL 20-0.9 MG/50ML-% IV SOLN
20.0000 mg | Freq: Once | INTRAVENOUS | Status: AC
  Administered 2020-09-05: 20 mg via INTRAVENOUS
  Filled 2020-09-05: qty 50

## 2020-09-05 MED ORDER — SODIUM CHLORIDE 0.9 % IV BOLUS
1000.0000 mL | Freq: Once | INTRAVENOUS | Status: AC
  Administered 2020-09-05: 1000 mL via INTRAVENOUS

## 2020-09-05 NOTE — ED Triage Notes (Signed)
Pt denies drinking any ETOH today. Pt uncooperative with letting triage get his EKG.

## 2020-09-05 NOTE — ED Triage Notes (Addendum)
Pt with abd pain for 30 minutes, states his acid reflux flared up.  Pt uncooperative in triage. Pt states he has taken OTC medicine but does not state what.   Pt would not sit up in triage, restless in the chair.  Unable to obtain EKG.  When asked a question, pt would just mumble yes or no.

## 2020-09-05 NOTE — ED Notes (Signed)
Pt tolerated po fluids and a snack well.

## 2020-09-05 NOTE — ED Provider Notes (Addendum)
[facility] EMERGENCY DEPARTMENT Provider Note   CSN: [id number] Arrival date & time: 09/05/20  1337     History Chief Complaint  Patient presents with  . Abdominal Pain    Gary [name] is a 54 y.o. male.  Patient indicates 'my reflux is flaring up'. Pt notes symptoms onset in past day, acute onset, moderate epigastric pain, dull, cramping, non radiating, constant, without specific exacerbating or alleviating factors. +nausea. No vomiting. No diarrhea. No abd distension or constipation. Denies fever or chills. No dysuria or gu c/o. No back or flank pain. No chest pain or sob. No cough or uri symptoms. Pt difficult historian - level 5 caveat.   The history is provided by the patient. The history is limited by the condition of the patient.  Abdominal Pain Associated symptoms: nausea   Associated symptoms: no chest pain, no cough, no dysuria, no fever, no shortness of breath, no sore throat and no vomiting        Past Medical History:  Diagnosis Date  . Acid reflux   . Arthritis    bilateral knees  . High cholesterol   . History of stomach ulcers   . Hypertension   . Pancreatitis, alcoholic, acute     Patient Active Problem List   Diagnosis Date Noted  . Acute gastritis without hemorrhage   . Essential hypertension   . Lactic acidosis   . Gastroesophageal reflux disease   . Polysubstance abuse (HCC)   . Lesion of liver   . Lesion of native kidney   . Intractable vomiting 01/11/2020    History reviewed. No pertinent surgical history.     Family History  Problem Relation Age of Onset  . Hypertension Mother     Social History   Tobacco Use  . Smoking status: Current Every Day Smoker    Packs/day: 1.00    Years: 20.00    Pack years: 20.00  . Smokeless tobacco: Never Used  Vaping Use  . Vaping Use: Never used  Substance Use Topics  . Alcohol use: Not Currently    Comment: pt states he has not consumed alcohol in 10 years  . Drug use: Not  Currently    Types: Marijuana, Cocaine    Comment: last marijuana use 05/2020    Home Medications Prior to Admission medications   Medication Sig Start Date End Date Taking? Authorizing Provider  metoprolol tartrate (LOPRESSOR) 50 MG tablet Take 1 tablet (50 mg total) by mouth 2 (two) times daily. 08/17/20   Jacquelin Hawking, PA-C    Allergies    Patient has no known allergies.  Review of Systems   Review of Systems  Constitutional: Negative for fever.  HENT: Negative for sore throat.   Eyes: Negative for redness.  Respiratory: Negative for cough and shortness of breath.   Cardiovascular: Negative for chest pain.  Gastrointestinal: Positive for abdominal pain and nausea. Negative for vomiting.  Endocrine: Negative for polyuria.  Genitourinary: Negative for dysuria and flank pain.  Musculoskeletal: Negative for back pain and neck pain.  Skin: Negative for rash.  Neurological: Negative for headaches.  Hematological: Does not bruise/bleed easily.  Psychiatric/Behavioral: Negative for confusion.    Physical Exam Updated Vital Signs BP (!) 190/115 (BP Location: Left Arm)   Pulse 82   Temp 97.8 F (36.6 C) (Oral)   Resp 18   Ht 1.778 m (5\' 10" )   Wt 83.9 kg   SpO2 100%   BMI 26.54 kg/m   Physical Exam Vitals and  nursing note reviewed.  Constitutional:      Appearance: Normal appearance. He is well-developed.  HENT:     Head: Atraumatic.     Nose: Nose normal.     Mouth/Throat:     Mouth: Mucous membranes are moist.     Pharynx: Oropharynx is clear. No oropharyngeal exudate or posterior oropharyngeal erythema.  Eyes:     General: No scleral icterus.    Conjunctiva/sclera: Conjunctivae normal.     Pupils: Pupils are equal, round, and reactive to light.  Neck:     Trachea: No tracheal deviation.  Cardiovascular:     Rate and Rhythm: Normal rate and regular rhythm.     Pulses: Normal pulses.     Heart sounds: Normal heart sounds. No murmur heard.  No friction  rub. No gallop.   Pulmonary:     Effort: Pulmonary effort is normal. No accessory muscle usage or respiratory distress.     Breath sounds: Normal breath sounds.  Abdominal:     General: Bowel sounds are normal. There is no distension.     Palpations: Abdomen is soft. There is no mass.     Tenderness: There is no abdominal tenderness. There is no guarding or rebound.     Hernia: No hernia is present.  Genitourinary:    Comments: No cva tenderness. Musculoskeletal:        General: No swelling or tenderness.     Cervical back: Normal range of motion and neck supple. No rigidity.  Skin:    General: Skin is warm and dry.     Findings: No rash.  Neurological:     Mental Status: He is alert.     Comments: Alert, speech clear.   Psychiatric:     Comments: Unusual affect. Responds to questions very quietly, limited response.      ED Results / Procedures / Treatments   Labs (all labs ordered are listed, but only abnormal results are displayed) Results for orders placed or performed during the hospital encounter of 09/05/20  CBC with Differential  Result Value Ref Range   WBC 5.9 4.0 - 10.5 K/uL   RBC 4.96 4.22 - 5.81 MIL/uL   Hemoglobin 16.1 13.0 - 17.0 g/dL   HCT 24.5 39 - 52 %   MCV 99.4 80.0 - 100.0 fL   MCH 32.5 26.0 - 34.0 pg   MCHC 32.7 30.0 - 36.0 g/dL   RDW 80.9 98.3 - 38.2 %   Platelets 384 150 - 400 K/uL   nRBC 0.0 0.0 - 0.2 %   Neutrophils Relative % 54 %   Neutro Abs 3.2 1.7 - 7.7 K/uL   Lymphocytes Relative 33 %   Lymphs Abs 2.0 0.7 - 4.0 K/uL   Monocytes Relative 11 %   Monocytes Absolute 0.6 0.1 - 1.0 K/uL   Eosinophils Relative 2 %   Eosinophils Absolute 0.1 0.0 - 0.5 K/uL   Basophils Relative 0 %   Basophils Absolute 0.0 0.0 - 0.1 K/uL   Immature Granulocytes 0 %   Abs Immature Granulocytes 0.01 0.00 - 0.07 K/uL  Comprehensive metabolic panel  Result Value Ref Range   Sodium 140 135 - 145 mmol/L   Potassium 3.7 3.5 - 5.1 mmol/L   Chloride 102 98 - 111  mmol/L   CO2 29 22 - 32 mmol/L   Glucose, Bld 85 70 - 99 mg/dL   BUN 18 6 - 20 mg/dL   Creatinine, Ser 5.05 0.61 - 1.24 mg/dL   Calcium 9.6  8.9 - 10.3 mg/dL   Total Protein 7.3 6.5 - 8.1 g/dL   Albumin 4.3 3.5 - 5.0 g/dL   AST 36 15 - 41 U/L   ALT 41 0 - 44 U/L   Alkaline Phosphatase 54 38 - 126 U/L   Total Bilirubin 0.5 0.3 - 1.2 mg/dL   GFR, Estimated >32 >20 mL/min   Anion gap 9 5 - 15  Lipase, blood  Result Value Ref Range   Lipase 84 (H) 11 - 51 U/L  Ethanol  Result Value Ref Range   Alcohol, Ethyl (B) <10 <10 mg/dL  Urinalysis, Routine w reflex microscopic  Result Value Ref Range   Color, Urine YELLOW YELLOW   APPearance CLEAR CLEAR   Specific Gravity, Urine 1.011 1.005 - 1.030   pH 7.0 5.0 - 8.0   Glucose, UA 50 (A) NEGATIVE mg/dL   Hgb urine dipstick NEGATIVE NEGATIVE   Bilirubin Urine NEGATIVE NEGATIVE   Ketones, ur 20 (A) NEGATIVE mg/dL   Protein, ur NEGATIVE NEGATIVE mg/dL   Nitrite NEGATIVE NEGATIVE   Leukocytes,Ua NEGATIVE NEGATIVE  Rapid urine drug screen (hospital performed)  Result Value Ref Range   Opiates NONE DETECTED NONE DETECTED   Cocaine POSITIVE (A) NONE DETECTED   Benzodiazepines NONE DETECTED NONE DETECTED   Amphetamines NONE DETECTED NONE DETECTED   Tetrahydrocannabinol POSITIVE (A) NONE DETECTED   Barbiturates NONE DETECTED NONE DETECTED    EKG None  Radiology No results found.  Procedures Procedures (including critical care time)  Medications Ordered in ED Medications  sodium chloride 0.9 % bolus 1,000 mL (has no administration in time range)  HYDROmorphone (DILAUDID) injection 0.5 mg (has no administration in time range)  ondansetron (ZOFRAN) injection 4 mg (has no administration in time range)  famotidine (PEPCID) IVPB 20 mg premix (has no administration in time range)    ED Course  I have reviewed the triage vital signs and the nursing notes.  Pertinent labs & imaging results that were available during my care of the  patient were reviewed by me and considered in my medical decision making (see chart for details).    MDM Rules/Calculators/A&P                          Labs sent. Iv ns bolus. pepcid iv. Dilaudid .5 mg iv. zofran iv.   Reviewed nursing notes and prior charts for additional history.   Labs reviewed/interpreted by me - chem normal, lfts normal, wbc normal.  Recheck abd soft nt.   Trial of po fluids.  No nausea/vomiting. No abd pain.   Additional labs reviewed/interpreted by me - ua w sm ketones. Po fluids, iv ns. UDS +thc and cocaine.   Recheck, during 5+ hours in ED, no episodes emesis. Recheck abd soft nt. Afebrile. Vitals normal.   Pt currently appears stable for d/c.  Return precautions provided.      Final Clinical Impression(s) / ED Diagnoses Final diagnoses:  None    Rx / DC Orders ED Discharge Orders    None         Cathren Laine, MD 09/05/20 1929

## 2020-09-05 NOTE — ED Notes (Signed)
Pt in bed with eyes closed, pt arouses to verbal stim, pt reports nausea, states that earlier he vomited into the trash can, pt states that he is hungry, crackers and water given for po challenge.

## 2020-09-05 NOTE — Discharge Instructions (Addendum)
It was our pleasure to provide your ER care today - we hope that you feel better.  Rest. Drink plenty of fluids. You may take zofran as need for nausea.   Avoid marijuana use, as marijuana/thc can cause a recurrent abdominal pain and vomiting syndrome called 'cannabinoid hyperemesis syndrome'  - see attached information.  Return to ER if worse, new symptoms, fevers, severe abdominal pain, persistent vomiting, or other concern.  You were given pain medication in the ER - no driving for the next 8 hours.

## 2020-09-09 ENCOUNTER — Ambulatory Visit: Payer: Self-pay | Admitting: Physician Assistant

## 2020-09-10 ENCOUNTER — Encounter: Payer: Self-pay | Admitting: Student

## 2020-10-07 ENCOUNTER — Ambulatory Visit: Payer: Self-pay | Admitting: Physician Assistant

## 2020-10-08 ENCOUNTER — Ambulatory Visit: Payer: Self-pay | Admitting: Physician Assistant

## 2020-10-12 ENCOUNTER — Encounter: Payer: Self-pay | Admitting: Student

## 2021-01-11 ENCOUNTER — Other Ambulatory Visit: Payer: Self-pay

## 2021-01-11 ENCOUNTER — Emergency Department (HOSPITAL_COMMUNITY): Admission: EM | Admit: 2021-01-11 | Discharge: 2021-01-11 | Discharge status: Against Medical Advice | Payer: Self-pay

## 2021-01-11 NOTE — ED Triage Notes (Signed)
Decided not to be seen 

## 2021-01-12 NOTE — Congregational Nurse Program (Signed)
  Dept: [phone]   Congregational Nurse Program Note  Date of Encounter: 01/12/2021  Past Medical History: Past Medical History:  Diagnosis Date  . Acid reflux   . Arthritis    bilateral knees  . High cholesterol   . History of stomach ulcers   . Hypertension   . Pancreatitis, alcoholic, acute     Encounter Details:  CNP Questionnaire - 01/12/21 1741      Questionnaire   Do you give verbal consent to treat you today? 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 Hasn't been seen by MD because he doesn't Have any insurance. Explained what the [facility] program was about and he stated he would like to be seen at the [facility]. BP 172/90, P 75, Random blood sugar-  152. Told will schedule appointment and give him a call. Jenene Slicker RN, [place], [phone]

## 2021-07-23 ENCOUNTER — Emergency Department (HOSPITAL_COMMUNITY)
Admission: EM | Admit: 2021-07-23 | Discharge: 2021-07-23 | Disposition: A | Discharge status: Home / Self Care | Payer: Self-pay | Attending: Emergency Medicine | Admitting: Emergency Medicine

## 2021-07-23 ENCOUNTER — Other Ambulatory Visit: Payer: Self-pay

## 2021-07-23 ENCOUNTER — Encounter (HOSPITAL_COMMUNITY): Payer: Self-pay | Admitting: Emergency Medicine

## 2021-07-23 ENCOUNTER — Encounter (HOSPITAL_COMMUNITY): Payer: Self-pay

## 2021-07-23 ENCOUNTER — Emergency Department (HOSPITAL_COMMUNITY)
Admission: EM | Admit: 2021-07-23 | Discharge: 2021-07-24 | Disposition: A | Discharge status: Home / Self Care | Payer: Self-pay | Attending: Emergency Medicine | Admitting: Emergency Medicine

## 2021-07-23 DIAGNOSIS — F17210 Nicotine dependence, cigarettes, uncomplicated: Secondary | ICD-10-CM | POA: Insufficient documentation

## 2021-07-23 DIAGNOSIS — I10 Essential (primary) hypertension: Secondary | ICD-10-CM | POA: Insufficient documentation

## 2021-07-23 DIAGNOSIS — R112 Nausea with vomiting, unspecified: Secondary | ICD-10-CM | POA: Insufficient documentation

## 2021-07-23 DIAGNOSIS — R1084 Generalized abdominal pain: Secondary | ICD-10-CM | POA: Insufficient documentation

## 2021-07-23 DIAGNOSIS — Z79899 Other long term (current) drug therapy: Secondary | ICD-10-CM | POA: Insufficient documentation

## 2021-07-23 DIAGNOSIS — R1013 Epigastric pain: Secondary | ICD-10-CM | POA: Insufficient documentation

## 2021-07-23 DIAGNOSIS — K219 Gastro-esophageal reflux disease without esophagitis: Secondary | ICD-10-CM | POA: Insufficient documentation

## 2021-07-23 LAB — COMPREHENSIVE METABOLIC PANEL
ALT: 40 U/L (ref 0–44)
AST: 44 U/L — ABNORMAL HIGH (ref 15–41)
Albumin: 4.5 g/dL (ref 3.5–5.0)
Alkaline Phosphatase: 59 U/L (ref 38–126)
Anion gap: 9 (ref 5–15)
BUN: 10 mg/dL (ref 6–20)
CO2: 28 mmol/L (ref 22–32)
Calcium: 9.9 mg/dL (ref 8.9–10.3)
Chloride: 106 mmol/L (ref 98–111)
Creatinine, Ser: 1.25 mg/dL — ABNORMAL HIGH (ref 0.61–1.24)
GFR, Estimated: >60 mL/min (ref >60)
Glucose, Bld: 99 mg/dL (ref 70–99)
Potassium: 3.5 mmol/L (ref 3.5–5.1)
Sodium: 143 mmol/L (ref 135–145)
Total Bilirubin: 0.5 mg/dL (ref 0.3–1.2)
Total Protein: 7.4 g/dL (ref 6.5–8.1)

## 2021-07-23 LAB — CBC WITH DIFFERENTIAL/PLATELET
Abs Immature Granulocytes: 0.02 10*3/uL (ref 0.00–0.07)
Basophils Absolute: 0 10*3/uL (ref 0.0–0.1)
Basophils Relative: 1 %
Eosinophils Absolute: 0.1 10*3/uL (ref 0.0–0.5)
Eosinophils Relative: 1 %
HCT: 46.3 % (ref 39.0–52.0)
Hemoglobin: 15.3 g/dL (ref 13.0–17.0)
Immature Granulocytes: 0 %
Lymphocytes Relative: 36 %
Lymphs Abs: 2.5 10*3/uL (ref 0.7–4.0)
MCH: 32.9 pg (ref 26.0–34.0)
MCHC: 33 g/dL (ref 30.0–36.0)
MCV: 99.6 fL (ref 80.0–100.0)
Monocytes Absolute: 0.8 10*3/uL (ref 0.1–1.0)
Monocytes Relative: 12 %
Neutro Abs: 3.5 10*3/uL (ref 1.7–7.7)
Neutrophils Relative %: 50 %
Platelets: 363 10*3/uL (ref 150–400)
RBC: 4.65 MIL/uL (ref 4.22–5.81)
RDW: 13.6 % (ref 11.5–15.5)
WBC: 7 10*3/uL (ref 4.0–10.5)
nRBC: 0 % (ref 0.0–0.2)

## 2021-07-23 LAB — URINALYSIS, ROUTINE W REFLEX MICROSCOPIC
Bilirubin Urine: NEGATIVE
Glucose, UA: NEGATIVE mg/dL
Hgb urine dipstick: NEGATIVE
Ketones, ur: NEGATIVE mg/dL
Leukocytes,Ua: NEGATIVE
Nitrite: NEGATIVE
Protein, ur: NEGATIVE mg/dL
Specific Gravity, Urine: 1.013 (ref 1.005–1.030)
pH: 6 (ref 5.0–8.0)

## 2021-07-23 LAB — LIPASE, BLOOD: Lipase: 42 U/L (ref 11–51)

## 2021-07-23 LAB — CBG MONITORING, ED: Glucose-Capillary: 87 mg/dL (ref 70–99)

## 2021-07-23 MED ORDER — PROMETHAZINE HCL 25 MG PO TABS: 25.0000 mg | ORAL_TABLET | Freq: Four times a day (QID) | ORAL | 0 refills | Status: AC | PRN

## 2021-07-23 MED ORDER — FAMOTIDINE IN NACL 20-0.9 MG/50ML-% IV SOLN
20.0000 mg | Freq: Once | INTRAVENOUS | Status: AC
  Administered 2021-07-23: 20 mg via INTRAVENOUS
  Filled 2021-07-23: qty 50

## 2021-07-23 MED ORDER — ONDANSETRON HCL 4 MG/2ML IJ SOLN
4.0000 mg | Freq: Once | INTRAMUSCULAR | Status: AC
  Administered 2021-07-23: 4 mg via INTRAVENOUS
  Filled 2021-07-23: qty 2

## 2021-07-23 MED ORDER — SODIUM CHLORIDE 0.9 % IV SOLN
25.0000 mg | Freq: Once | INTRAVENOUS | Status: AC
  Administered 2021-07-23: 25 mg via INTRAVENOUS
  Filled 2021-07-23: qty 1

## 2021-07-23 MED ORDER — DROPERIDOL 2.5 MG/ML IJ SOLN
2.5000 mg | Freq: Once | INTRAMUSCULAR | Status: AC
  Administered 2021-07-23: 2.5 mg via INTRAVENOUS
  Filled 2021-07-23: qty 2

## 2021-07-23 MED ORDER — SODIUM CHLORIDE 0.9 % IV BOLUS
1000.0000 mL | Freq: Once | INTRAVENOUS | Status: AC
  Administered 2021-07-23: 1000 mL via INTRAVENOUS

## 2021-07-23 MED ORDER — LACTATED RINGERS IV BOLUS
1000.0000 mL | Freq: Once | INTRAVENOUS | Status: AC
  Administered 2021-07-23: 1000 mL via INTRAVENOUS

## 2021-07-23 MED ORDER — HYDROMORPHONE HCL 1 MG/ML IJ SOLN
1.0000 mg | Freq: Once | INTRAMUSCULAR | Status: AC
  Administered 2021-07-23: 1 mg via INTRAVENOUS
  Filled 2021-07-23: qty 1

## 2021-07-23 NOTE — ED Triage Notes (Signed)
Pt. Arrived with complaints of acid reflux. Pt. States they have abdominal pain. Pt. Was vomiting in triage and became very diaphoretic. Pt. Sat on the floor in pain. Dr. Particia Nearing at bedside for assessment.

## 2021-07-23 NOTE — ED Triage Notes (Addendum)
Pt brought in by [facility] for c/o abdominal pain and nausea. Pt seen here earlier today for same. Pt noted to be sticking his finger down his throat in an attempt to make himself vomit. Blood Glucose per EMS was 154.

## 2021-07-23 NOTE — ED Notes (Signed)
Pt making innappropriate comments to this nurse. Asked pt to please be respectful and not talk about nurses appearance because it was not appropriate.

## 2021-07-23 NOTE — ED Notes (Signed)
Pt continues to yell out despite reviewing  how to use call bell. States we are not helping him because we have not given him Vicodin or any anti-nausea medication. Explained to pt that he got dilaudid and took a nap after it was given. Also explained to pt that he got zofran first, which he was told at the time and when that didn't help he was given phenergen per his request. He states that he was not given phenergen because it is in a vial and not a bag. Nurse explained that we stock them as a drip now. Pt states that we do not care about him or we would have given him vicodin by now. States we wants to go to a different hospital. EDP notified of situation.

## 2021-07-23 NOTE — ED Provider Notes (Signed)
[facility] EMERGENCY DEPARTMENT Provider Note   CSN: [id number] Arrival date & time: 07/23/21  1238     History Chief Complaint  Patient presents with   Abdominal Pain    Gary [name] is a 55 y.o. male.  Pt presents to the ED today with n/v and abdominal pain.  Pt has a hx of GERD and thinks he may be having a flare up.  He was vomiting in triage.  He said he has to get an IV and meds every once in awhile for this problem.        Past Medical History:  Diagnosis Date   Acid reflux    Arthritis    bilateral knees   High cholesterol    History of stomach ulcers    Hypertension    Pancreatitis, alcoholic, acute     Patient Active Problem List   Diagnosis Date Noted   Acute gastritis without hemorrhage    Essential hypertension    Lactic acidosis    Gastroesophageal reflux disease    Polysubstance abuse (HCC)    Lesion of liver    Lesion of native kidney    Intractable vomiting 01/11/2020    History reviewed. No pertinent surgical history.     Family History  Problem Relation Age of Onset   Hypertension Mother     Social History   Tobacco Use   Smoking status: Every Day    Packs/day: 1.00    Years: 20.00    Pack years: 20.00    Types: Cigarettes   Smokeless tobacco: Never  Vaping Use   Vaping Use: Never used  Substance Use Topics   Alcohol use: Not Currently    Comment: pt states he has not consumed alcohol in 10 years   Drug use: Not Currently    Types: Marijuana, Cocaine    Comment: last marijuana use 05/2020    Home Medications Prior to Admission medications   Medication Sig Start Date End Date Taking? Authorizing Provider  famotidine (PEPCID) 20 MG tablet Take 20 mg by mouth 2 (two) times daily. 05/28/21  Yes [provider]  promethazine (PHENERGAN) 25 MG tablet Take 1 tablet (25 mg total) by mouth every 6 (six) hours as needed for nausea or vomiting. 07/23/21  Yes Jacalyn Lefevre, MD  metoprolol tartrate (LOPRESSOR) 50 MG  tablet Take 1 tablet (50 mg total) by mouth 2 (two) times daily. Patient not taking: Reported on 07/23/2021 08/17/20   Jacquelin Hawking, PA-C  ondansetron (ZOFRAN ODT) 8 MG disintegrating tablet Take 1 tablet (8 mg total) by mouth every 8 (eight) hours as needed for nausea or vomiting. Patient not taking: Reported on 07/23/2021 09/05/20   Cathren Laine, MD    Allergies    Patient has no known allergies.  Review of Systems   Review of Systems  Gastrointestinal:  Positive for abdominal pain, nausea and vomiting.  All other systems reviewed and are negative.  Physical Exam Updated Vital Signs BP (!) 193/93 (BP Location: Left Arm)   Pulse (!) 48   Resp 19   Ht 5\' 10"  (1.778 m)   Wt 78.5 kg   SpO2 99%   BMI 24.84 kg/m   Physical Exam Vitals and nursing note reviewed.  Constitutional:      Appearance: He is well-developed. He is diaphoretic.  HENT:     Head: Normocephalic and atraumatic.     Mouth/Throat:     Mouth: Mucous membranes are moist.     Pharynx: Oropharynx is clear.  Eyes:     Extraocular Movements: Extraocular movements intact.     Pupils: Pupils are equal, round, and reactive to light.  Cardiovascular:     Rate and Rhythm: Normal rate and regular rhythm.     Heart sounds: Normal heart sounds.  Pulmonary:     Effort: Pulmonary effort is normal.     Breath sounds: Normal breath sounds.  Abdominal:     General: Abdomen is flat.     Palpations: Abdomen is soft.     Tenderness: There is generalized abdominal tenderness.  Skin:    General: Skin is warm.     Capillary Refill: Capillary refill takes less than 2 seconds.  Neurological:     General: No focal deficit present.     Mental Status: He is alert and oriented to person, place, and time.  Psychiatric:        Mood and Affect: Mood is anxious.    ED Results / Procedures / Treatments   Labs (all labs ordered are listed, but only abnormal results are displayed) Labs Reviewed  COMPREHENSIVE METABOLIC PANEL -  Abnormal; Notable for the following components:      Result Value   Creatinine, Ser 1.25 (*)    AST 44 (*)    All other components within normal limits  CBC WITH DIFFERENTIAL/PLATELET  LIPASE, BLOOD  URINALYSIS, ROUTINE W REFLEX MICROSCOPIC  CBG MONITORING, ED    EKG EKG Interpretation  Date/Time:  Friday July 23 2021 13:01:23 EDT Ventricular Rate:  78 PR Interval:  163 QRS Duration: 86 QT Interval:  374 QTC Calculation: 426 R Axis:   94 Text Interpretation: Sinus rhythm Borderline right axis deviation Left ventricular hypertrophy Anterior ST elevation, probably due to LVH No significant change since last tracing Confirmed by Jacalyn Lefevre [phone]) on 07/23/2021 1:41:37 PM  Radiology No results found.  Procedures Procedures   Medications Ordered in ED Medications  sodium chloride 0.9 % bolus 1,000 mL (0 mLs Intravenous Stopped 07/23/21 1417)  HYDROmorphone (DILAUDID) injection 1 mg (1 mg Intravenous Given 07/23/21 1252)  ondansetron (ZOFRAN) injection 4 mg (4 mg Intravenous Given 07/23/21 1253)  famotidine (PEPCID) IVPB 20 mg premix (0 mg Intravenous Stopped 07/23/21 1328)  promethazine (PHENERGAN) 25 mg in sodium chloride 0.9 % 50 mL IVPB (0 mg Intravenous Stopped 07/23/21 1521)    ED Course  I have reviewed the triage vital signs and the nursing notes.  Pertinent labs & imaging results that were available during my care of the patient were reviewed by me and considered in my medical decision making (see chart for details).    MDM Rules/Calculators/A&P                           Pt is still complaining of nausea. I suspect there is a component of drug withdrawal because he keeps asking for pain meds.  Pt is agitated that we are not giving him more than we have given him.  Urine ketones neg.  Pt is stable for d/c.  Return if worse.    Final Clinical Impression(s) / ED Diagnoses Final diagnoses:  Generalized abdominal pain  Non-intractable vomiting with nausea,  unspecified vomiting type    Rx / DC Orders ED Discharge Orders          Ordered    promethazine (PHENERGAN) 25 MG tablet  Every 6 hours PRN        07/23/21 1522  Jacalyn Lefevre, MD 07/23/21 1524

## 2021-07-24 MED ORDER — OMEPRAZOLE 20 MG PO CPDR: 20.0000 mg | DELAYED_RELEASE_CAPSULE | Freq: Every day | ORAL | 0 refills | Status: AC

## 2021-07-24 MED ORDER — ALUM & MAG HYDROXIDE-SIMETH 400-400-40 MG/5ML PO SUSP: 15.0000 mL | Freq: Four times a day (QID) | ORAL | 0 refills | Status: AC | PRN

## 2021-07-24 NOTE — ED Provider Notes (Signed)
[facility] EMERGENCY DEPARTMENT Provider Note   CSN: [id number] Arrival date & time: 07/23/21  2133     History Chief Complaint  Patient presents with   Abdominal Pain   Nausea    Gary [name] is a 55 y.o. male.  55 year old male who presents to the emerged part today secondary to abdominal pain.  Patient was seen earlier today where he had labs that were unremarkable.  Patient states that he went to his sister's house and started on epigastric presents here again for further evaluation.  Nothing else is changed.  Patient apparently was seen sticking his finger down his throat and the ER prior to my evaluation. Patient denies this.   Abdominal Pain     Past Medical History:  Diagnosis Date   Acid reflux    Arthritis    bilateral knees   High cholesterol    History of stomach ulcers    Hypertension    Pancreatitis, alcoholic, acute     Patient Active Problem List   Diagnosis Date Noted   Acute gastritis without hemorrhage    Essential hypertension    Lactic acidosis    Gastroesophageal reflux disease    Polysubstance abuse (HCC)    Lesion of liver    Lesion of native kidney    Intractable vomiting 01/11/2020    History reviewed. No pertinent surgical history.     Family History  Problem Relation Age of Onset   Hypertension Mother     Social History   Tobacco Use   Smoking status: Every Day    Packs/day: 1.00    Years: 20.00    Pack years: 20.00    Types: Cigarettes   Smokeless tobacco: Never  Vaping Use   Vaping Use: Never used  Substance Use Topics   Alcohol use: Not Currently    Comment: pt states he has not consumed alcohol in 10 years   Drug use: Not Currently    Types: Marijuana, Cocaine    Comment: last marijuana use 05/2020    Home Medications Prior to Admission medications   Medication Sig Start Date End Date Taking? Authorizing Provider  alum & mag hydroxide-simeth (MAALOX PLUS) 400-400-40 MG/5ML suspension Take 15 mLs by  mouth every 6 (six) hours as needed for indigestion. 07/24/21  Yes Shandra Szymborski, Barbara Cower, MD  omeprazole (PRILOSEC) 20 MG capsule Take 1 capsule (20 mg total) by mouth daily. 07/24/21  Yes Belenda Alviar, Barbara Cower, MD  famotidine (PEPCID) 20 MG tablet Take 20 mg by mouth 2 (two) times daily. 05/28/21   [provider]  promethazine (PHENERGAN) 25 MG tablet Take 1 tablet (25 mg total) by mouth every 6 (six) hours as needed for nausea or vomiting. 07/23/21   Jacalyn Lefevre, MD    Allergies    Patient has no known allergies.  Review of Systems   Review of Systems  Gastrointestinal:  Positive for abdominal pain.  All other systems reviewed and are negative.  Physical Exam Updated Vital Signs BP (!) 187/86 (BP Location: Left Arm)   Pulse (!) 55   Temp 98.4 F (36.9 C) (Oral)   Resp 18   Ht 5\' 10"  (1.778 m)   Wt 79 kg   SpO2 99%   BMI 24.99 kg/m   Physical Exam Vitals and nursing note reviewed.  Constitutional:      Appearance: He is well-developed.     Comments: Patient expressing his nausea very prominently, gagging and retching as loud as possible  HENT:  Head: Normocephalic and atraumatic.  Cardiovascular:     Rate and Rhythm: Normal rate.  Pulmonary:     Effort: Pulmonary effort is normal. No respiratory distress.  Abdominal:     General: Abdomen is flat. There is no distension.  Musculoskeletal:        General: Normal range of motion.     Cervical back: Normal range of motion.  Skin:    General: Skin is warm and dry.     Coloration: Skin is not cyanotic or jaundiced.  Neurological:     General: No focal deficit present.     Mental Status: He is alert.    ED Results / Procedures / Treatments   Labs (all labs ordered are listed, but only abnormal results are displayed) Labs Reviewed - No data to display  EKG None  Radiology No results found.  Procedures Procedures   Medications Ordered in ED Medications  droperidol (INAPSINE) 2.5 MG/ML injection 2.5 mg (2.5 mg  Intravenous Given 07/23/21 2341)  lactated ringers bolus 1,000 mL (0 mLs Intravenous Stopped 07/24/21 0115)    ED Course  I have reviewed the triage vital signs and the nursing notes.  Pertinent labs & imaging results that were available during my care of the patient were reviewed by me and considered in my medical decision making (see chart for details).    MDM Rules/Calculators/A&P                         At this time I do not see any indication to recheck labs.  Was going give him some fluids and some nausea medicine.  Some of this I believe is psychogenic however could have a component of gastroparesis as well.  Patient is sleeping well without any vomiting for at least a couple hours after the antiemetics.  We will continue to monitor.  Patient feels better. Tolerating PO. Stable for dc.   Final Clinical Impression(s) / ED Diagnoses Final diagnoses:  Nausea and vomiting, unspecified vomiting type    Rx / DC Orders ED Discharge Orders          Ordered    omeprazole (PRILOSEC) 20 MG capsule  Daily        07/24/21 0319    alum & mag hydroxide-simeth (MAALOX PLUS) 400-400-40 MG/5ML suspension  Every 6 hours PRN        07/24/21 0319             Shirl Ludington, Barbara Cower, MD 07/24/21 6433

## 2021-07-26 ENCOUNTER — Other Ambulatory Visit: Payer: Self-pay

## 2021-07-26 ENCOUNTER — Emergency Department (HOSPITAL_COMMUNITY)
Admission: EM | Admit: 2021-07-26 | Discharge: 2021-07-27 | Disposition: A | Discharge status: Home / Self Care | Payer: Self-pay | Attending: Emergency Medicine | Admitting: Emergency Medicine

## 2021-07-26 ENCOUNTER — Encounter (HOSPITAL_COMMUNITY): Payer: Self-pay | Admitting: *Deleted

## 2021-07-26 DIAGNOSIS — F17210 Nicotine dependence, cigarettes, uncomplicated: Secondary | ICD-10-CM | POA: Insufficient documentation

## 2021-07-26 DIAGNOSIS — I10 Essential (primary) hypertension: Secondary | ICD-10-CM | POA: Insufficient documentation

## 2021-07-26 DIAGNOSIS — Z79899 Other long term (current) drug therapy: Secondary | ICD-10-CM | POA: Insufficient documentation

## 2021-07-26 DIAGNOSIS — R1084 Generalized abdominal pain: Secondary | ICD-10-CM | POA: Insufficient documentation

## 2021-07-26 DIAGNOSIS — R112 Nausea with vomiting, unspecified: Secondary | ICD-10-CM | POA: Insufficient documentation

## 2021-07-26 LAB — URINALYSIS, ROUTINE W REFLEX MICROSCOPIC
Bilirubin Urine: NEGATIVE
Glucose, UA: NEGATIVE mg/dL
Ketones, ur: 5 mg/dL — AB
Leukocytes,Ua: NEGATIVE
Nitrite: NEGATIVE
Protein, ur: 100 mg/dL — AB
Specific Gravity, Urine: 1.025 (ref 1.005–1.030)
pH: 5 (ref 5.0–8.0)

## 2021-07-26 LAB — CBC
HCT: 50.3 % (ref 39.0–52.0)
Hemoglobin: 17.3 g/dL — ABNORMAL HIGH (ref 13.0–17.0)
MCH: 33.6 pg (ref 26.0–34.0)
MCHC: 34.4 g/dL (ref 30.0–36.0)
MCV: 97.7 fL (ref 80.0–100.0)
Platelets: 347 10*3/uL (ref 150–400)
RBC: 5.15 MIL/uL (ref 4.22–5.81)
RDW: 13.3 % (ref 11.5–15.5)
WBC: 9.7 10*3/uL (ref 4.0–10.5)
nRBC: 0 % (ref 0.0–0.2)

## 2021-07-26 LAB — LIPASE, BLOOD: Lipase: 27 U/L (ref 11–51)

## 2021-07-26 LAB — MAGNESIUM: Magnesium: 2.4 mg/dL (ref 1.7–2.4)

## 2021-07-26 LAB — COMPREHENSIVE METABOLIC PANEL
ALT: 37 U/L (ref 0–44)
AST: 28 U/L (ref 15–41)
Albumin: 4 g/dL (ref 3.5–5.0)
Alkaline Phosphatase: 53 U/L (ref 38–126)
Anion gap: 10 (ref 5–15)
BUN: 21 mg/dL — ABNORMAL HIGH (ref 6–20)
CO2: 27 mmol/L (ref 22–32)
Calcium: 8.7 mg/dL — ABNORMAL LOW (ref 8.9–10.3)
Chloride: 100 mmol/L (ref 98–111)
Creatinine, Ser: 1.12 mg/dL (ref 0.61–1.24)
GFR, Estimated: >60 mL/min (ref >60)
Glucose, Bld: 107 mg/dL — ABNORMAL HIGH (ref 70–99)
Potassium: 3.1 mmol/L — ABNORMAL LOW (ref 3.5–5.1)
Sodium: 137 mmol/L (ref 135–145)
Total Bilirubin: 0.8 mg/dL (ref 0.3–1.2)
Total Protein: 6.9 g/dL (ref 6.5–8.1)

## 2021-07-26 LAB — RAPID URINE DRUG SCREEN, HOSP PERFORMED
Amphetamines: NOT DETECTED
Barbiturates: NOT DETECTED
Benzodiazepines: NOT DETECTED
Cocaine: POSITIVE — AB
Opiates: NOT DETECTED
Tetrahydrocannabinol: POSITIVE — AB

## 2021-07-26 MED ORDER — POTASSIUM CHLORIDE 10 MEQ/100ML IV SOLN
10.0000 meq | INTRAVENOUS | Status: AC
  Administered 2021-07-26 (×3): 10 meq via INTRAVENOUS
  Filled 2021-07-26 (×3): qty 100

## 2021-07-26 MED ORDER — LIDOCAINE VISCOUS HCL 2 % MT SOLN
15.0000 mL | Freq: Once | OROMUCOSAL | Status: AC
  Administered 2021-07-26: 15 mL via ORAL
  Filled 2021-07-26: qty 15

## 2021-07-26 MED ORDER — HALOPERIDOL LACTATE 5 MG/ML IJ SOLN
5.0000 mg | Freq: Once | INTRAMUSCULAR | Status: AC
  Administered 2021-07-26: 5 mg via INTRAVENOUS
  Filled 2021-07-26: qty 1

## 2021-07-26 MED ORDER — ALUM & MAG HYDROXIDE-SIMETH 200-200-20 MG/5ML PO SUSP
30.0000 mL | Freq: Once | ORAL | Status: AC
  Administered 2021-07-26: 30 mL via ORAL
  Filled 2021-07-26: qty 30

## 2021-07-26 MED ORDER — LORAZEPAM 2 MG/ML IJ SOLN
1.0000 mg | Freq: Once | INTRAMUSCULAR | Status: AC
  Administered 2021-07-26: 1 mg via INTRAVENOUS
  Filled 2021-07-26: qty 1

## 2021-07-26 MED ORDER — SODIUM CHLORIDE 0.9 % IV BOLUS
1000.0000 mL | Freq: Once | INTRAVENOUS | Status: AC
  Administered 2021-07-26: 1000 mL via INTRAVENOUS

## 2021-07-26 MED ORDER — ONDANSETRON HCL 4 MG/2ML IJ SOLN
4.0000 mg | Freq: Once | INTRAMUSCULAR | Status: AC
  Administered 2021-07-26: 4 mg via INTRAVENOUS
  Filled 2021-07-26: qty 2

## 2021-07-26 NOTE — ED Provider Notes (Signed)
[facility] EMERGENCY DEPARTMENT Provider Note   CSN: [id number] Arrival date & time: 07/26/21  1414     History Chief Complaint  Patient presents with   Abdominal Pain    Gary [name] is a 55 y.o. male.  HPI He presents for evaluation of persistent vomiting and abdominal pain, present for several days and now on his third visit for same.  Most recently seen at [facility] emergency department on 07/24/2021.  At that time he was started on Reglan which he is taking.  He smokes cigarettes.  He denies use of marijuana.  He states he has had blood in his emesis several times.  He denies diarrhea.  No other similar problems in the past.  There are no other known active modifying factors.    Past Medical History:  Diagnosis Date   Acid reflux    Arthritis    bilateral knees   High cholesterol    History of stomach ulcers    Hypertension    Pancreatitis, alcoholic, acute     Patient Active Problem List   Diagnosis Date Noted   Acute gastritis without hemorrhage    Essential hypertension    Lactic acidosis    Gastroesophageal reflux disease    Polysubstance abuse (HCC)    Lesion of liver    Lesion of native kidney    Intractable vomiting 01/11/2020    History reviewed. No pertinent surgical history.     Family History  Problem Relation Age of Onset   Hypertension Mother     Social History   Tobacco Use   Smoking status: Every Day    Packs/day: 1.00    Years: 20.00    Pack years: 20.00    Types: Cigarettes   Smokeless tobacco: Never  Vaping Use   Vaping Use: Never used  Substance Use Topics   Alcohol use: Not Currently    Comment: pt states he has not consumed alcohol in 10 years   Drug use: Not Currently    Types: Marijuana, Cocaine    Comment: last marijuana use 05/2020    Home Medications Prior to Admission medications   Medication Sig Start Date End Date Taking? Authorizing Provider  famotidine (PEPCID) 20 MG tablet  Take 20 mg by mouth 2 (two) times daily. 05/28/21  Yes [provider]  metoCLOPramide (REGLAN) 10 MG tablet Take 10 mg by mouth every 6 (six) hours as needed for nausea. 07/24/21 07/31/21 Yes [provider]  omeprazole (PRILOSEC) 20 MG capsule Take 1 capsule (20 mg total) by mouth daily. 07/24/21  Yes Mesner, Barbara Cower, MD  alum & mag hydroxide-simeth (MAALOX PLUS) 400-400-40 MG/5ML suspension Take 15 mLs by mouth every 6 (six) hours as needed for indigestion. Patient not taking: Reported on 07/26/2021 07/24/21   Mesner, Barbara Cower, MD  promethazine (PHENERGAN) 25 MG tablet Take 1 tablet (25 mg total) by mouth every 6 (six) hours as needed for nausea or vomiting. Patient not taking: Reported on 07/26/2021 07/23/21   Jacalyn Lefevre, MD    Allergies    Patient has no known allergies.  Review of Systems   Review of Systems  All other systems reviewed and are negative.  Physical Exam Updated Vital Signs BP 115/73 (BP Location: Left Arm)   Pulse 76   Temp 98.1 F (36.7 C) (Oral)   Resp 16   Ht 5\' 10"  (1.778 m)   Wt 88.5 kg   SpO2 98%   BMI 27.98 kg/m   Physical  Exam Vitals and nursing note reviewed.  Constitutional:      General: He is not in acute distress.    Appearance: He is well-developed. He is not ill-appearing, toxic-appearing or diaphoretic.  HENT:     Head: Normocephalic and atraumatic.     Right Ear: External ear normal.     Left Ear: External ear normal.  Eyes:     Conjunctiva/sclera: Conjunctivae normal.     Pupils: Pupils are equal, round, and reactive to light.  Neck:     Trachea: Phonation normal.  Cardiovascular:     Rate and Rhythm: Normal rate and regular rhythm.     Heart sounds: Normal heart sounds.  Pulmonary:     Effort: Pulmonary effort is normal.     Breath sounds: Normal breath sounds.  Abdominal:     General: There is no distension.     Palpations: Abdomen is soft. There is no mass.     Tenderness: There is abdominal tenderness (Severe,  diffuse, with light touch). There is guarding.     Hernia: No hernia is present.  Musculoskeletal:        General: Normal range of motion.     Cervical back: Normal range of motion and neck supple.  Skin:    General: Skin is warm and dry.  Neurological:     Mental Status: He is alert and oriented to person, place, and time.     Cranial Nerves: No cranial nerve deficit.     Sensory: No sensory deficit.     Motor: No abnormal muscle tone.     Coordination: Coordination normal.  Psychiatric:        Mood and Affect: Mood normal.        Behavior: Behavior normal.        Thought Content: Thought content normal.        Judgment: Judgment normal.    ED Results / Procedures / Treatments   Labs (all labs ordered are listed, but only abnormal results are displayed) Labs Reviewed  COMPREHENSIVE METABOLIC PANEL - Abnormal; Notable for the following components:      Result Value   Potassium 3.1 (*)    Glucose, Bld 107 (*)    BUN 21 (*)    Calcium 8.7 (*)    All other components within normal limits  CBC - Abnormal; Notable for the following components:   Hemoglobin 17.3 (*)    All other components within normal limits  URINALYSIS, ROUTINE W REFLEX MICROSCOPIC - Abnormal; Notable for the following components:   APPearance HAZY (*)    Hgb urine dipstick SMALL (*)    Ketones, ur 5 (*)    Protein, ur 100 (*)    Bacteria, UA RARE (*)    All other components within normal limits  RAPID URINE DRUG SCREEN, HOSP PERFORMED - Abnormal; Notable for the following components:   Cocaine POSITIVE (*)    Tetrahydrocannabinol POSITIVE (*)    All other components within normal limits  LIPASE, BLOOD  MAGNESIUM    EKG EKG Interpretation  Date/Time:  Monday July 26 2021 15:30:03 EDT Ventricular Rate:  82 PR Interval:  136 QRS Duration: 82 QT Interval:  338 QTC Calculation: 394 R Axis:   97 Text Interpretation: Normal sinus rhythm Rightward axis Biventricular hypertrophy Abnormal ECG  Baseline wander Artifact since last tracing no significant change Confirmed by Mancel Bale [phone]) on 07/26/2021 3:37:09 PM  Radiology No results found.  Procedures Procedures   Medications Ordered in ED Medications  alum & mag hydroxide-simeth (MAALOX/MYLANTA) 200-200-20 MG/5ML suspension 30 mL (30 mLs Oral Given 07/26/21 1906)    And  lidocaine (XYLOCAINE) 2 % viscous mouth solution 15 mL (15 mLs Oral Given 07/26/21 1905)  potassium chloride 10 mEq in 100 mL IVPB (0 mEq Intravenous Stopped 07/27/21 0046)  LORazepam (ATIVAN) injection 1 mg (1 mg Intravenous Given 07/26/21 2032)  haloperidol lactate (HALDOL) injection 5 mg (5 mg Intravenous Given 07/26/21 2031)  ondansetron (ZOFRAN) injection 4 mg (4 mg Intravenous Given 07/26/21 2031)  sodium chloride 0.9 % bolus 1,000 mL (0 mLs Intravenous Stopped 07/27/21 0046)    ED Course  I have reviewed the triage vital signs and the nursing notes.  Pertinent labs & imaging results that were available during my care of the patient were reviewed by me and considered in my medical decision making (see chart for details).    MDM Rules/Calculators/A&P                            Patient Vitals for the past 24 hrs:  BP Temp Temp src Pulse Resp SpO2 Height Weight  07/27/21 0051 -- 98.1 F (36.7 C) Oral -- -- -- -- --  07/26/21 2300 115/73 -- -- 76 16 98 % -- --  07/26/21 2230 106/73 -- -- -- -- -- -- --  07/26/21 2200 107/80 -- -- -- (!) 26 -- -- --  07/26/21 2130 (!) 150/95 -- -- -- 18 -- -- --  07/26/21 2100 -- -- -- -- 20 -- -- --  07/26/21 2045 -- -- -- -- 20 -- -- --  07/26/21 2030 (!) 131/100 -- -- 74 11 99 % -- --  07/26/21 2015 -- -- -- 84 (!) 22 97 % -- --  07/26/21 2000 128/87 -- -- (!) 49 19 (!) 83 % -- --  07/26/21 1945 -- -- -- 92 (!) 24 99 % -- --  07/26/21 1908 136/88 -- -- (!) 101 17 100 % -- --  07/26/21 1526 -- -- -- -- -- -- 5\' 10"  (1.778 m) 88.5 kg  07/26/21 1524 (!) 167/116 97.7 F (36.5 C) Oral 93 (!) 22 100 % -- --   07/26/21 1430 (!) 143/126 98.3 F (36.8 C) -- 99 18 98 % -- --    At the time of discharge- reevaluation with update and discussion. After initial assessment and treatment, an updated evaluation reveals he is comfortable, tolerating oral diet and all questions and findings were discussed. 09/25/21   Medical Decision Making:  This patient is presenting for evaluation of vomiting and abdominal pain, which does require a range of treatment options, and is a complaint that involves a moderate risk of morbidity and mortality. The differential diagnoses include gastritis, cyclic vomiting/hyperemesis cannabinoid syndrome. I decided to review old records, and in summary millage male, presenting after very recent evaluation for same at another ED.  He smokes cigarettes and has history of GERD.  He has recurrent symptoms of vomiting and history of gastritis..  I did not require additional historical information from anyone.  Clinical Laboratory Tests Ordered, included CBC, Metabolic panel, Urinalysis, and magnesium, drug screen . Review indicates normal except potassium low, glucose high, BUN high, urine drug screen positive for cocaine and THC, with blood, ketones, protein, RBCs, white cells and rare bacteria..   Critical Interventions-clinical evaluation, laboratory testing, medication treatment, observation and reassessment  After These Interventions, the Patient was reevaluated and was found stable, with controlled  symptoms, currently under treatment as an outpatient.  Patient with findings for cocaine and marijuana abuse.  Suspect that these are involved with his discomfort.  No indication for hospitalization at this time.  CRITICAL CARE-no Performed by: Mancel Bale  Nursing Notes Reviewed/ Care Coordinated Applicable Imaging Reviewed Interpretation of Laboratory Data incorporated into ED treatment  The patient appears reasonably screened and/or stabilized for discharge and I doubt any  other medical condition or other [facility] requiring further screening, evaluation, or treatment in the ED at this time prior to discharge.  Plan: Home Medications-continue current; Home Treatments-avoid illegal substances; return here if the recommended treatment, does not improve the symptoms; Recommended follow up-PCP, as needed     Final Clinical Impression(s) / ED Diagnoses Final diagnoses:  Generalized abdominal pain  Nausea and vomiting, unspecified vomiting type    Rx / DC Orders ED Discharge Orders     None        Mancel Bale, MD 07/27/21 1056

## 2021-07-26 NOTE — ED Notes (Signed)
Pt tolerated his sandwich as well as his grape juices well. No nausea or vomiting.

## 2021-07-26 NOTE — Discharge Instructions (Addendum)
The testing today did not show any serious problems.  Make sure you are getting plenty of rest, drink a lot of fluids and take the medicine, metoclopramide which was prescribed at [facility].  Use the attached instructions to find a primary care doctor to see here in [place] for ongoing care.  Avoid using illegal substances.

## 2021-07-26 NOTE — ED Triage Notes (Addendum)
Pt c/o lower abdominal pain that radiates up into his chest x 3 days. Pt reports it feels like acid reflux. Also c/o n/v.

## 2021-07-26 NOTE — ED Provider Notes (Addendum)
Emergency Medicine Provider Triage Evaluation Note  Gary [name] , a 55 y.o. male  was evaluated in triage.  Pt complains of lower abdominal pain that radiates intermittently up to the level of his chest.  Symptoms present for 3 days.  He was seen here x2 without improvement.  He also presented at [facility] ER for evaluation of the same symptoms.  He describes a squeezing of his abdomen associated with nausea and vomiting.  States pain feels like his reflux.  States that he was given metoclopramide at [facility], but medication is not helping.  States his blood pressures been high, he has history of hypertension, but not currently taking medication. No hx of CAD.  No fever, chills, shortness of breath back or flank pain.  Review of Systems  Positive: Abdominal pain, vomiting, chest pain Negative: Diarrhea, fever, back or flank pain and dysuria  Physical Exam  BP (!) 167/116 (BP Location: Right Arm)   Pulse 93   Temp 97.7 F (36.5 C) (Oral)   Resp (!) 22   Ht 5\' 10"  (1.778 m)   Wt 88.5 kg   SpO2 100%   BMI 27.98 kg/m  Gen:   Awake, no distress   Resp:  Normal effort  MSK:   Moves extremities without difficulty  Other:  Tenderness of entire lower abdomen.  No guarding or rebound.  Abdomen soft.  Medical Decision Making  Medically screening exam initiated at 5:32 PM.  Appropriate orders placed.  ARHUM PEEPLES was informed that the remainder of the evaluation will be completed by another provider, this initial triage assessment does not replace that evaluation, and the importance of remaining in the ED until their evaluation is complete.  Patient evaluated here x2 on 07/23/2021 and at [facility] on 07/24/2021 for abdominal pain.  CT abdomen pelvis performed at [facility].  states his pain feels like a flare of his reflux.  Chest pain today he is hypertensive.  He will need further evaluation emergency department.  Patient agreeable to plan.     [facility], PA-C 07/26/21 1740     09/25/21, PA-C 07/26/21 1741    09/25/21, MD 07/27/21 1028

## 2021-07-26 NOTE — ED Notes (Signed)
Pt given a sandwich as well as two cups of grape juice.

## 2021-07-30 ENCOUNTER — Encounter: Payer: Self-pay | Admitting: Internal Medicine

## 2021-08-30 ENCOUNTER — Encounter: Payer: Self-pay | Admitting: Gastroenterology

## 2021-08-30 ENCOUNTER — Ambulatory Visit: Payer: Self-pay | Admitting: Gastroenterology

## 2021-11-16 IMAGING — DX DG KNEE 1-2V*R*
2 series · 2 of 2 positions shown · non-contrast
Comparison: None.

CLINICAL DATA: Bilateral knee pain, medial swelling

EXAM:
RIGHT KNEE - 1-2 VIEW

[knee ap]
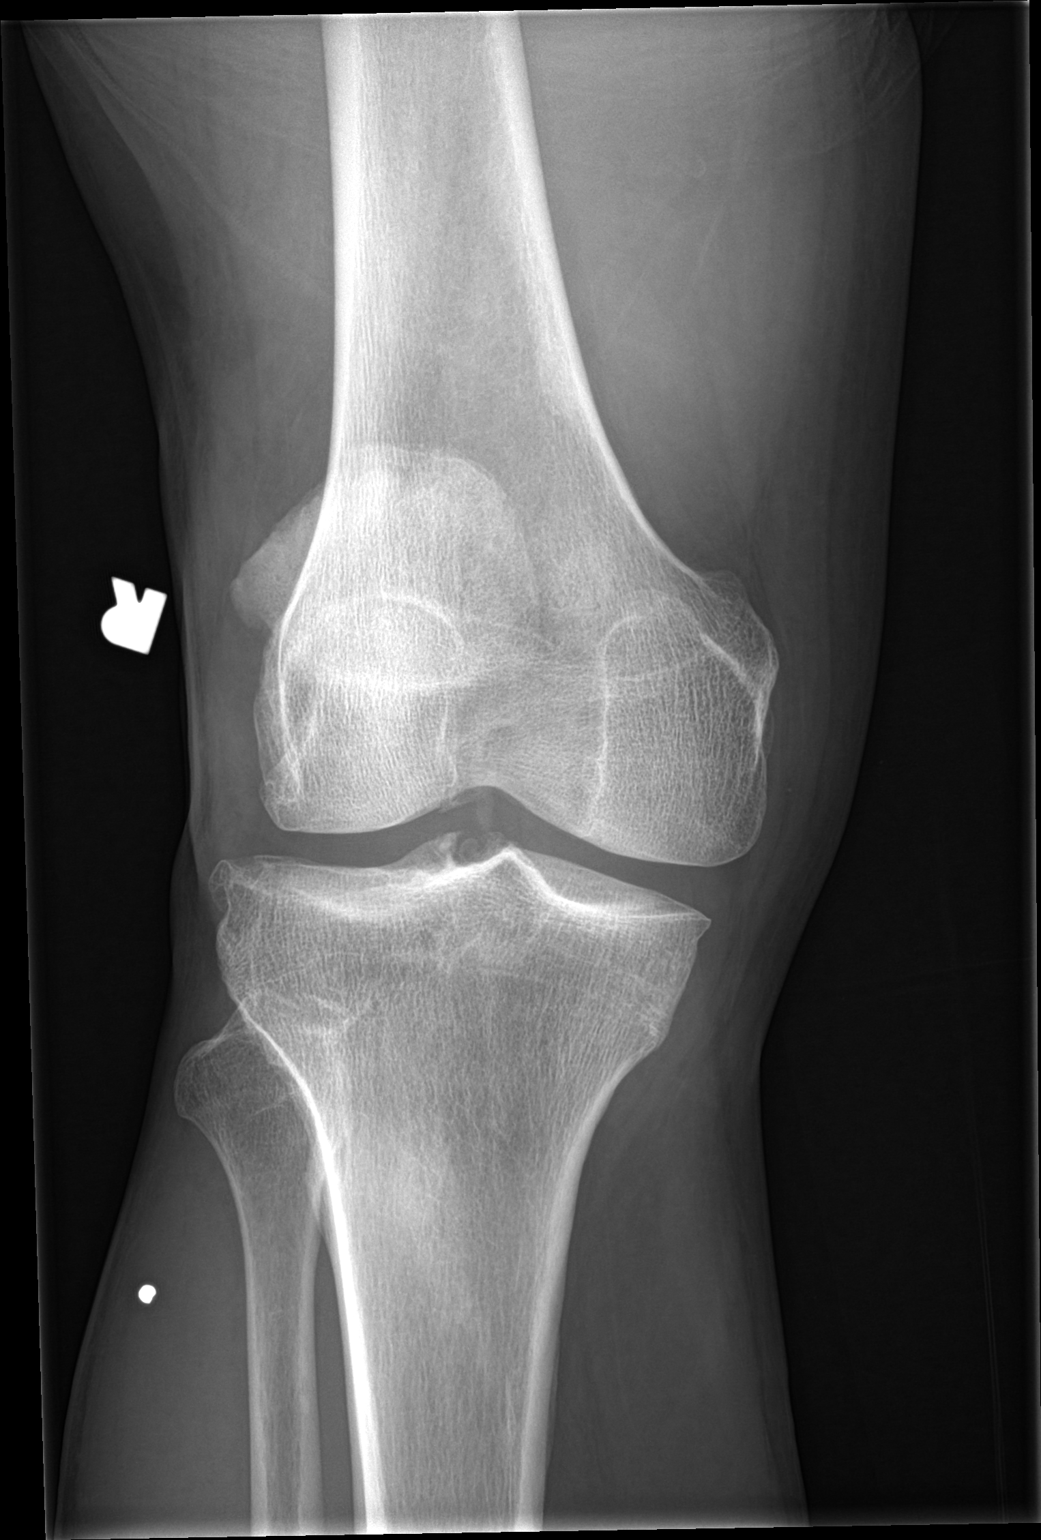

[knee lat]
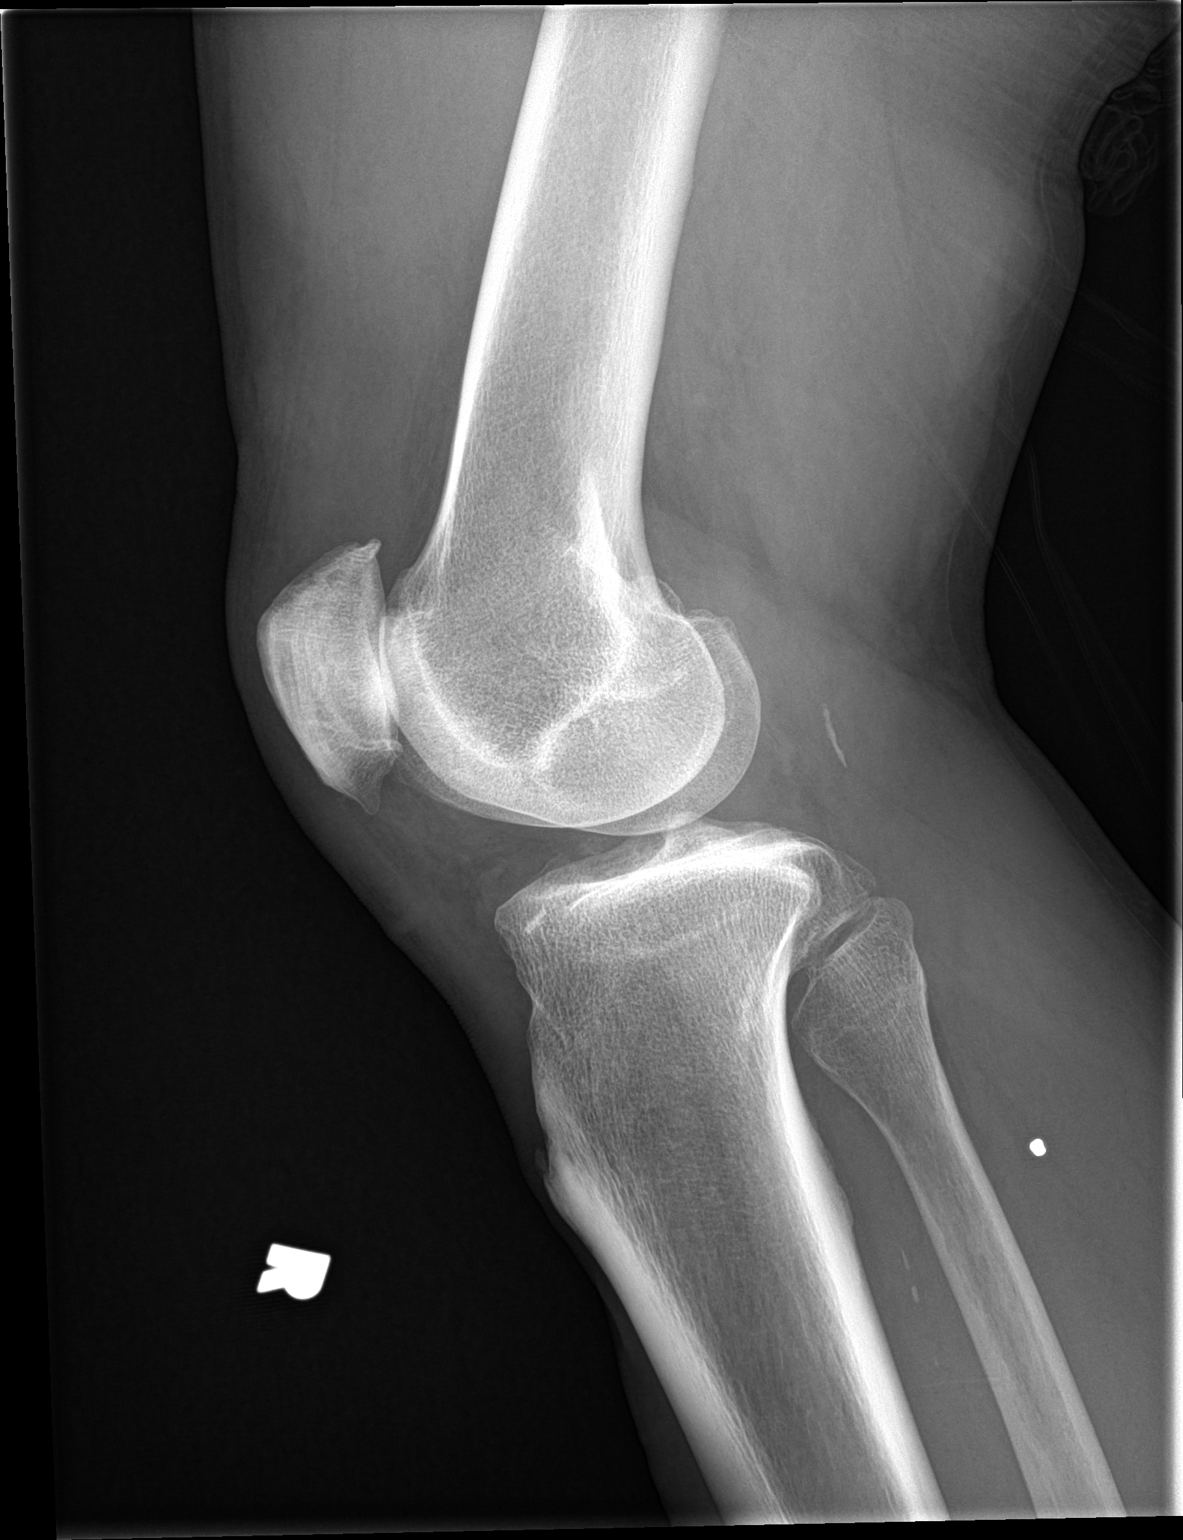

[2 of 2 positions shown; findings below may reference images not displayed]

FINDINGS: Degenerative changes most pronounced in the patellofemoral
compartment with joint space narrowing and spurring. Small joint
effusion. No acute bony abnormality. Specifically, no fracture,
subluxation, or dislocation. Small calix foreign body seen within
the soft tissues in the proximal lateral calf.
IMPRESSION: Degenerative changes, most pronounced in the patellofemoral
compartment with small joint effusion. No acute bony abnormality.

## 2022-05-05 ENCOUNTER — Encounter (HOSPITAL_COMMUNITY): Payer: Self-pay

## 2022-05-05 ENCOUNTER — Other Ambulatory Visit: Payer: Self-pay

## 2022-05-05 ENCOUNTER — Emergency Department (HOSPITAL_COMMUNITY)
Admission: EM | Admit: 2022-05-05 | Discharge: 2022-05-05 | Disposition: A | Discharge status: Home / Self Care | Payer: Self-pay | Attending: Emergency Medicine | Admitting: Emergency Medicine

## 2022-05-05 DIAGNOSIS — Z207 Contact with and (suspected) exposure to pediculosis, acariasis and other infestations: Secondary | ICD-10-CM | POA: Insufficient documentation

## 2022-05-05 DIAGNOSIS — L299 Pruritus, unspecified: Secondary | ICD-10-CM | POA: Insufficient documentation

## 2022-05-05 MED ORDER — PERMETHRIN 5 % EX CREA: TOPICAL_CREAM | CUTANEOUS | 0 refills | Status: AC

## 2022-05-05 NOTE — Discharge Instructions (Addendum)
I have sent you in a cream to clear out any scabies due to your exposure.  Follow-up prescription directions.

## 2022-05-05 NOTE — ED Notes (Signed)
Dc instructions and scripts reviewed with pt no questions or concerns at this time. Will follow up as needed ?

## 2022-05-05 NOTE — ED Provider Notes (Signed)
[facility] EMERGENCY DEPARTMENT Provider Note   CSN: [id number] Arrival date & time: 05/05/22  1628     History  No chief complaint on file.   Gary [name] is a 56 y.o. male who presents to the ED for evaluation of a itchy rash all over his body, worse on his hands.  He states that he has had a recent exposure to rabies.  No treatment prior to arrival.  He denies allergies, allergic reaction.  No new soaps, detergents or lotions.  He denies shortness of breath, nausea, vomiting and diarrhea.  HPI     Home Medications Prior to Admission medications   Medication Sig Start Date End Date Taking? Authorizing Provider  permethrin (ELIMITE) 5 % cream Apply cream thoroughly into the skin from the neck down to the soles of the feet, including under the fingernails, toenails and in between the fingers and toes as well.  Leave on overnight for at least 10 hours then wash off and shower.  Repeat again in 2 weeks. 05/05/22  Yes Raynald Blend R, PA-C  alum & mag hydroxide-simeth (MAALOX PLUS) 400-400-40 MG/5ML suspension Take 15 mLs by mouth every 6 (six) hours as needed for indigestion. Patient not taking: Reported on 07/26/2021 07/24/21   Mesner, Barbara Cower, MD  famotidine (PEPCID) 20 MG tablet Take 20 mg by mouth 2 (two) times daily. 05/28/21   [provider]  metoCLOPramide (REGLAN) 10 MG tablet Take 10 mg by mouth every 6 (six) hours as needed for nausea. 07/24/21 07/31/21  [provider]  omeprazole (PRILOSEC) 20 MG capsule Take 1 capsule (20 mg total) by mouth daily. 07/24/21   Mesner, Barbara Cower, MD  promethazine (PHENERGAN) 25 MG tablet Take 1 tablet (25 mg total) by mouth every 6 (six) hours as needed for nausea or vomiting. Patient not taking: Reported on 07/26/2021 07/23/21   Jacalyn Lefevre, MD      Allergies    Patient has no known allergies.    Review of Systems   Review of Systems  Physical Exam Updated Vital Signs BP (!) 160/88 (BP Location: Right Arm)   Pulse 84    Temp 98.2 F (36.8 C) (Oral)   Resp 18   Ht 5\' 11"  (1.803 m)   Wt 86.2 kg   SpO2 100%   BMI 26.50 kg/m  Physical Exam Vitals and nursing note reviewed.  Constitutional:      General: He is not in acute distress.    Appearance: He is not ill-appearing.  HENT:     Head: Atraumatic.  Eyes:     Conjunctiva/sclera: Conjunctivae normal.  Cardiovascular:     Rate and Rhythm: Normal rate and regular rhythm.     Pulses: Normal pulses.     Heart sounds: No murmur heard. Pulmonary:     Effort: Pulmonary effort is normal. No respiratory distress.     Breath sounds: Normal breath sounds.  Abdominal:     General: Abdomen is flat. There is no distension.     Palpations: Abdomen is soft.     Tenderness: There is no abdominal tenderness.  Musculoskeletal:        General: Normal range of motion.     Cervical back: Normal range of motion.  Skin:    General: Skin is warm and dry.     Capillary Refill: Capillary refill takes less than 2 seconds.     Comments: Erythematous patches noted on the hands and upper extremities, no urticaria  Neurological:     General:  No focal deficit present.     Mental Status: He is alert.  Psychiatric:        Mood and Affect: Mood normal.     ED Results / Procedures / Treatments   Labs (all labs ordered are listed, but only abnormal results are displayed) Labs Reviewed - No data to display  EKG None  Radiology No results found.  Procedures Procedures    Medications Ordered in ED Medications - No data to display  ED Course/ Medical Decision Making/ A&P                           Medical Decision Making Risk Prescription drug management.   Patient presents to the ED for pruritic rash with known scabies exposure.  Differentials include allergic reaction, contact dermatitis, scabies.  Vitals are without significant abnormality.  On exam, he has small erythematous patches on his hands and his forearms that he notes is quite pruritic.  I did  not observe the characteristic linear excoriations within suggestive of scabies, however given his known exposure, will treat with permethrin cream.  Patient expresses understanding is amenable to plan.  Discharged home in good condition.  Final Clinical Impression(s) / ED Diagnoses Final diagnoses:  Scabies exposure    Rx / DC Orders ED Discharge Orders          Ordered    permethrin (ELIMITE) 5 % cream        05/05/22 1926              Janell Quiet, [place] 05/05/22 2353    Pricilla Loveless, MD 05/09/22 [phone]

## 2022-05-05 NOTE — ED Triage Notes (Signed)
Pt presents to ED with complaints of rash all over body, states he was exposed to scabies.

## 2022-05-09 ENCOUNTER — Encounter (HOSPITAL_COMMUNITY): Payer: Self-pay | Admitting: *Deleted

## 2022-05-09 ENCOUNTER — Emergency Department (HOSPITAL_COMMUNITY): Payer: Self-pay

## 2022-05-09 ENCOUNTER — Emergency Department (HOSPITAL_COMMUNITY)
Admission: EM | Admit: 2022-05-09 | Discharge: 2022-05-09 | Disposition: A | Discharge status: Home / Self Care | Payer: Self-pay | Attending: Emergency Medicine | Admitting: Emergency Medicine

## 2022-05-09 ENCOUNTER — Other Ambulatory Visit: Payer: Self-pay

## 2022-05-09 DIAGNOSIS — S40211A Abrasion of right shoulder, initial encounter: Secondary | ICD-10-CM | POA: Insufficient documentation

## 2022-05-09 DIAGNOSIS — W19XXXA Unspecified fall, initial encounter: Secondary | ICD-10-CM | POA: Insufficient documentation

## 2022-05-09 DIAGNOSIS — I10 Essential (primary) hypertension: Secondary | ICD-10-CM | POA: Insufficient documentation

## 2022-05-09 DIAGNOSIS — Y9302 Activity, running: Secondary | ICD-10-CM | POA: Insufficient documentation

## 2022-05-09 DIAGNOSIS — Y92194 Driveway of other specified residential institution as the place of occurrence of the external cause: Secondary | ICD-10-CM | POA: Insufficient documentation

## 2022-05-09 MED ORDER — BACITRACIN ZINC 500 UNIT/GM EX OINT
TOPICAL_OINTMENT | Freq: Two times a day (BID) | CUTANEOUS | Status: DC
  Filled 2022-05-09: qty 0.9

## 2022-05-09 NOTE — Discharge Instructions (Signed)
You were seen in the emergency department today after a fall and scraping her right shoulder.  Your x-ray is normal.  Please return for any concerns.

## 2022-05-09 NOTE — ED Triage Notes (Signed)
Pt states he fell on gravel driveway, abrasion noted to right shoulder, denies hitting his head.   Reported by [employer] that EMS was called to scene and requested pt to be evaluated.  Pt in police custody.

## 2022-05-09 NOTE — ED Provider Notes (Signed)
[facility] EMERGENCY DEPARTMENT Provider Note   CSN: [id number] Arrival date & time: 05/09/22  1803     History  Chief Complaint  Patient presents with   Gary [name] is a 56 y.o. male. With past medical history of HTN, polysubstance abuse who presents to the emergency department for fall.  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  States that he fell onto his right shoulder which is swollen and painful.  Denies other injuries.   Fall       Home Medications Prior to Admission medications   Medication Sig Start Date End Date Taking? Authorizing Provider  alum & mag hydroxide-simeth (MAALOX PLUS) 400-400-40 MG/5ML suspension Take 15 mLs by mouth every 6 (six) hours as needed for indigestion. Patient not taking: Reported on 07/26/2021 07/24/21   Mesner, Barbara Cower, MD  famotidine (PEPCID) 20 MG tablet Take 20 mg by mouth 2 (two) times daily. 05/28/21   [provider]  metoCLOPramide (REGLAN) 10 MG tablet Take 10 mg by mouth every 6 (six) hours as needed for nausea. 07/24/21 07/31/21  [provider]  omeprazole (PRILOSEC) 20 MG capsule Take 1 capsule (20 mg total) by mouth daily. 07/24/21   Mesner, Barbara Cower, MD  permethrin (ELIMITE) 5 % cream Apply cream thoroughly into the skin from the neck down to the soles of the feet, including under the fingernails, toenails and in between the fingers and toes as well.  Leave on overnight for at least 10 hours then wash off and shower.  Repeat again in 2 weeks. 05/05/22   Janell Quiet, PA-C  promethazine (PHENERGAN) 25 MG tablet Take 1 tablet (25 mg total) by mouth every 6 (six) hours as needed for nausea or vomiting. Patient not taking: Reported on 07/26/2021 07/23/21   Jacalyn Lefevre, MD      Allergies    Patient has no known allergies.    Review of Systems   Review of Systems  Musculoskeletal:  Positive for joint swelling.  All other systems reviewed and are  negative.   Physical Exam Updated Vital Signs BP (!) 175/88   Pulse 78   Temp 97.6 F (36.4 C) (Oral)   Resp 17   Ht 5' 10.5" (1.791 m)   Wt 68.5 kg   SpO2 100%   BMI 21.36 kg/m  Physical Exam Vitals and nursing note reviewed.  Constitutional:      General: He is not in acute distress.    Appearance: Normal appearance. He is not ill-appearing.  HENT:     Head: Normocephalic and atraumatic.  Eyes:     General: No scleral icterus. Cardiovascular:     Pulses: Normal pulses.  Pulmonary:     Effort: Pulmonary effort is normal. No respiratory distress.  Musculoskeletal:        General: Tenderness and signs of injury present. Normal range of motion.     Cervical back: Normal range of motion and neck supple.     Comments: Abrasion to the right shoulder with surrounding swelling  Skin:    General: Skin is warm and dry.     Findings: No rash.  Neurological:     General: No focal deficit present.     Mental Status: He is alert and oriented to person, place, and time. Mental status is at baseline.  Psychiatric:        Mood and Affect: Mood normal.        Behavior: Behavior  normal.        Thought Content: Thought content normal.        Judgment: Judgment normal.    ED Results / Procedures / Treatments   Labs (all labs ordered are listed, but only abnormal results are displayed) Labs Reviewed - No data to display  EKG None  Radiology DG Shoulder Right  Result Date: 05/09/2022 CLINICAL DATA:  Fall in driveway.  Abrasion to right shoulder. EXAM: RIGHT SHOULDER - 2+ VIEW COMPARISON:  None Available. FINDINGS: There is no evidence of fracture or dislocation. Mild acromioclavicular degenerative change. Spurring from the inferior humeral head. Subcortical cystic change of the lateral humeral head. Soft tissues are unremarkable. No fracture of included right ribs. IMPRESSION: 1. No acute fracture or subluxation of the right shoulder. 2. Mild osteoarthritis. Subcortical cystic  change of the lateral humeral head can be seen in the setting of chronic rotator cuff arthropathy. Electronically Signed   By: Narda Rutherford M.D.   On: 05/09/2022 19:32    Procedures Procedures   Medications Ordered in ED Medications - No data to display  ED Course/ Medical Decision Making/ A&P                           Medical Decision Making Amount and/or Complexity of Data Reviewed Radiology: ordered.  This patient presents to the ED for concern of shoulder pain, this involves an extensive number of treatment options, and is a complaint that carries with it a high risk of complications and morbidity.  The differential diagnosis includes fracture, dislocation, ligamentous or tenderness injury  Co morbidities that complicate the patient evaluation Polysubstance abuse, hypertension  Additional history obtained:  Additional history obtained from: Emergency planning/management officer at bedside External records from outside source obtained and reviewed including: None  Imaging Studies ordered:  I ordered imaging studies which included x-ray.  I independently reviewed & interpreted imaging & am in agreement with radiology impression. Imaging shows: X-ray of the right shoulder is negative  Medications  -I reviewed the patient's home medications and did not make adjustments. -I did not prescribe new home medications.  SDH Polysubstance abuse  ED Course:  56 year old male who presents emergency department after fall onto the right shoulder.  He does have an abrasion and mild swelling to the right shoulder.  Tenderness to palpation of the area.  He is neurovascularly intact.  Compartments are soft.  Range of motion is intact.  Obtain x-ray which is negative.  He is instructed use Tylenol or Motrin as needed.  Can return for worsening symptoms.  Verbalized understanding.   After consideration of the diagnostic results and the patients response to treatment, I feel that the patent would benefit from  discharge. The patient has been appropriately medically screened and/or stabilized in the ED. I have low suspicion for any other emergent medical condition which would require further screening, evaluation or treatment in the ED or require inpatient management. The patient is overall well appearing and non-toxic in appearance. They are hemodynamically stable at time of discharge.   Final Clinical Impression(s) / ED Diagnoses Final diagnoses:  Fall, initial encounter    Rx / DC Orders ED Discharge Orders     None         Cristopher Peru, PA-C 05/10/22 1536    Vanetta Mulders, MD 05/13/22 [phone]

## 2024-08-01 ENCOUNTER — Telehealth: Payer: Self-pay

## 2024-08-01 NOTE — Telephone Encounter (Signed)
 Opened in error

## 2024-09-30 ENCOUNTER — Emergency Department (HOSPITAL_COMMUNITY)

## 2024-09-30 ENCOUNTER — Other Ambulatory Visit: Payer: Self-pay

## 2024-09-30 ENCOUNTER — Encounter (HOSPITAL_COMMUNITY): Payer: Self-pay

## 2024-09-30 ENCOUNTER — Emergency Department (HOSPITAL_COMMUNITY)
Admission: EM | Admit: 2024-09-30 | Discharge: 2024-09-30 | Disposition: A | Discharge status: Home / Self Care | Attending: Emergency Medicine | Admitting: Emergency Medicine

## 2024-09-30 DIAGNOSIS — R110 Nausea: Secondary | ICD-10-CM

## 2024-09-30 LAB — URINALYSIS, ROUTINE W REFLEX MICROSCOPIC
Bacteria, UA: NONE SEEN
Bilirubin Urine: NEGATIVE
Glucose, UA: NEGATIVE mg/dL
Hgb urine dipstick: NEGATIVE
Ketones, ur: 5 mg/dL — AB
Leukocytes,Ua: NEGATIVE
Nitrite: NEGATIVE
Protein, ur: 100 mg/dL — AB
Specific Gravity, Urine: 1.026 (ref 1.005–1.030)
pH: 5 (ref 5.0–8.0)

## 2024-09-30 LAB — CBC WITH DIFFERENTIAL/PLATELET
Abs Immature Granulocytes: 0.02 K/uL (ref 0.00–0.07)
Basophils Absolute: 0 K/uL (ref 0.0–0.1)
Basophils Relative: 0 %
Eosinophils Absolute: 0.1 K/uL (ref 0.0–0.5)
Eosinophils Relative: 2 %
HCT: 45 % (ref 39.0–52.0)
Hemoglobin: 15 g/dL (ref 13.0–17.0)
Immature Granulocytes: 0 %
Lymphocytes Relative: 35 %
Lymphs Abs: 2.6 K/uL (ref 0.7–4.0)
MCH: 32 pg (ref 26.0–34.0)
MCHC: 33.3 g/dL (ref 30.0–36.0)
MCV: 95.9 fL (ref 80.0–100.0)
Monocytes Absolute: 0.6 K/uL (ref 0.1–1.0)
Monocytes Relative: 9 %
Neutro Abs: 4.2 K/uL (ref 1.7–7.7)
Neutrophils Relative %: 54 %
Platelets: 347 K/uL (ref 150–400)
RBC: 4.69 MIL/uL (ref 4.22–5.81)
RDW: 14.4 % (ref 11.5–15.5)
WBC: 7.6 K/uL (ref 4.0–10.5)
nRBC: 0 % (ref 0.0–0.2)

## 2024-09-30 LAB — COMPREHENSIVE METABOLIC PANEL WITH GFR
ALT: 37 U/L (ref 0–44)
AST: 38 U/L (ref 15–41)
Albumin: 3.8 g/dL (ref 3.5–5.0)
Alkaline Phosphatase: 58 U/L (ref 38–126)
Anion gap: 9 (ref 5–15)
BUN: 14 mg/dL (ref 6–20)
CO2: 24 mmol/L (ref 22–32)
Calcium: 8.9 mg/dL (ref 8.9–10.3)
Chloride: 105 mmol/L (ref 98–111)
Creatinine, Ser: 1.03 mg/dL (ref 0.61–1.24)
GFR, Estimated: >60 mL/min (ref >60)
Glucose, Bld: 89 mg/dL (ref 70–99)
Potassium: 3.7 mmol/L (ref 3.5–5.1)
Sodium: 138 mmol/L (ref 135–145)
Total Bilirubin: 1.1 mg/dL (ref 0.0–1.2)
Total Protein: 6.8 g/dL (ref 6.5–8.1)

## 2024-09-30 LAB — RAPID URINE DRUG SCREEN, HOSP PERFORMED
Amphetamines: NOT DETECTED
Barbiturates: NOT DETECTED
Benzodiazepines: NOT DETECTED
Cocaine: POSITIVE — AB
Opiates: NOT DETECTED
Tetrahydrocannabinol: POSITIVE — AB

## 2024-09-30 LAB — LIPASE, BLOOD: Lipase: 25 U/L (ref 11–51)

## 2024-09-30 MED ORDER — ONDANSETRON 8 MG PO TBDP: 8.0000 mg | ORAL_TABLET | Freq: Three times a day (TID) | ORAL | 0 refills | Status: AC | PRN

## 2024-09-30 MED ORDER — ONDANSETRON 4 MG PO TBDP
4.0000 mg | ORAL_TABLET | Freq: Once | ORAL | Status: AC
  Administered 2024-09-30: 4 mg via ORAL
  Filled 2024-09-30: qty 1

## 2024-09-30 MED ORDER — LISINOPRIL 5 MG PO TABS: 5.0000 mg | ORAL_TABLET | Freq: Every day | ORAL | 0 refills | Status: AC

## 2024-09-30 NOTE — ED Provider Triage Note (Signed)
 Emergency Medicine Provider Triage Evaluation Note  Gary [name] , a 58 y.o. male  was evaluated in triage.  Pt complains of n, v, cough.  Review of Systems  Positive: Nausea, vomiting, cough Negative: Pain, fever  Physical Exam  BP (!) 169/112 (BP Location: Right Arm)   Pulse 93   Temp 97.6 F (36.4 C)   Resp 17   SpO2 97%  Gen:   Awake, no distress   Resp:  Normal effort  MSK:   Moves extremities without difficulty  Other:    Medical Decision Making  Medically screening exam initiated at 1:27 PM.  Appropriate orders placed.  Gary [name] was informed that the remainder of the evaluation will be completed by another provider, this initial triage assessment does not replace that evaluation, and the importance of remaining in the ED until their evaluation is complete.  States he takes Prevacid at home for reflux that helps me with my nausea. Recurrent issue. Did not take his Prevacid. No pain, no fever.   Odell Balls, PA-C 09/30/24 1331

## 2024-09-30 NOTE — ED Provider Notes (Signed)
 [facility] EMERGENCY DEPARTMENT AT [facility] Provider Note   CSN: [id number] Arrival date & time: 09/30/24  1258     Patient presents with: Nausea and Emesis   [name] is a 58 y.o. male.   HPI 58 year old male presents today complaining of nausea and vomiting.  States that symptoms began yesterday and has vomited approximately 8 times.  Vomiting has been nonbloody and not bilious.  He is unsure whether or not he has had a fever.  He denies any nasal congestion or cough.  He denies any abdominal pain.  Significant other is with him and she is beginning to feel nauseated today but otherwise no known sick contacts.    Prior to Admission medications   Medication Sig Start Date End Date Taking? Authorizing Provider  alum & mag hydroxide-simeth (MAALOX PLUS) 400-400-40 MG/5ML suspension Take 15 mLs by mouth every 6 (six) hours as needed for indigestion. Patient not taking: Reported on 07/26/2021 07/24/21   Mesner, Selinda, MD  famotidine  (PEPCID ) 20 MG tablet Take 20 mg by mouth 2 (two) times daily. 05/28/21   [provider]  metoCLOPramide (REGLAN) 10 MG tablet Take 10 mg by mouth every 6 (six) hours as needed for nausea. 07/24/21 07/31/21  [provider]  omeprazole  (PRILOSEC) 20 MG capsule Take 1 capsule (20 mg total) by mouth daily. 07/24/21   Mesner, Selinda, MD  permethrin  (ELIMITE ) 5 % cream Apply cream thoroughly into the skin from the neck down to the soles of the feet, including under the fingernails, toenails and in between the fingers and toes as well.  Leave on overnight for at least 10 hours then wash off and shower.  Repeat again in 2 weeks. 05/05/22   Conklin, Erica R, PA-C  promethazine  (PHENERGAN ) 25 MG tablet Take 1 tablet (25 mg total) by mouth every 6 (six) hours as needed for nausea or vomiting. Patient not taking: Reported on 07/26/2021 07/23/21   Haviland, Julie, MD    Allergies: Patient has no known allergies.    Review of  Systems  Updated Vital Signs BP (!) 169/112 (BP Location: Right Arm)   Pulse 93   Temp 97.6 F (36.4 C)   Resp 17   Ht 1.778 m (5' 10)   Wt 93.4 kg   SpO2 97%   BMI 29.56 kg/m   Physical Exam Vitals and nursing note reviewed.  Constitutional:      Appearance: He is well-developed.  HENT:     Head: Normocephalic and atraumatic.     Right Ear: External ear normal.     Left Ear: External ear normal.     Nose: Nose normal.  Eyes:     Extraocular Movements: Extraocular movements intact.  Neck:     Trachea: No tracheal deviation.  Cardiovascular:     Rate and Rhythm: Normal rate and regular rhythm.  Pulmonary:     Effort: Pulmonary effort is normal.  Abdominal:     General: Abdomen is flat. Bowel sounds are normal. There is no distension.     Palpations: Abdomen is soft. There is no mass.     Tenderness: There is no abdominal tenderness.     Hernia: No hernia is present.  Musculoskeletal:        General: Normal range of motion.     Cervical back: Normal range of motion.  Skin:    General: Skin is warm and dry.     Capillary Refill: Capillary refill takes less than 2 seconds.  Neurological:     Mental Status: He is alert and oriented to person, place, and time.  Psychiatric:        Mood and Affect: Mood normal.        Behavior: Behavior normal.     (all labs ordered are listed, but only abnormal results are displayed) Labs Reviewed  CBC WITH DIFFERENTIAL/PLATELET  COMPREHENSIVE METABOLIC PANEL WITH GFR  LIPASE, BLOOD  URINALYSIS, ROUTINE W REFLEX MICROSCOPIC  RAPID URINE DRUG SCREEN, HOSP PERFORMED    EKG: None  Radiology: No results found.   Procedures   Medications Ordered in the ED  ondansetron  (ZOFRAN -ODT) disintegrating tablet 4 mg (4 mg Oral Given 09/30/24 1335)                                    Medical Decision Making  58 year old man presents today complaining of nausea and vomiting.  Physical exam is significant for hypertension and  patient has known history of same.  He has not been taking his lisinopril  as he has been out of it. Patient's abdominal exam is benign and no tenderness is noted. Differential diagnosis nausea vomiting is includes but not limited to infectious etiology such as viral syndromes, food sources at bacterial etiologies, intra-abdominal etiology such as pancreatitis, appendicitis, cholecystitis.  Doubt acute intra-abdominal etiology as patient's abdomen is soft and nontender.  He received Zofran  at triage.       Final diagnoses:  None    ED Discharge Orders     None          Levander Houston, MD 09/30/24 1609

## 2024-09-30 NOTE — ED Notes (Signed)
Pt given soda and crackers for PO challenge.

## 2024-09-30 NOTE — ED Triage Notes (Signed)
 Pt states that he has been having nausea and vomiting since yesterday. Pt does not know if he has been running fever.
